# Patient Record
Sex: Female | Born: 1985 | Race: White | Hispanic: No | Marital: Married | Smoking: Former smoker
Health system: Southern US, Community
[De-identification: ages and names within clinical notes are randomized; demographics above are authoritative.]

## PROBLEM LIST (undated history)

## (undated) ENCOUNTER — Inpatient Hospital Stay (HOSPITAL_COMMUNITY): Payer: Self-pay

## (undated) DIAGNOSIS — K219 Gastro-esophageal reflux disease without esophagitis: Secondary | ICD-10-CM

## (undated) DIAGNOSIS — Z8619 Personal history of other infectious and parasitic diseases: Secondary | ICD-10-CM

## (undated) DIAGNOSIS — M5430 Sciatica, unspecified side: Secondary | ICD-10-CM

## (undated) DIAGNOSIS — L680 Hirsutism: Secondary | ICD-10-CM

## (undated) DIAGNOSIS — R12 Heartburn: Secondary | ICD-10-CM

## (undated) DIAGNOSIS — E282 Polycystic ovarian syndrome: Secondary | ICD-10-CM

## (undated) DIAGNOSIS — I079 Rheumatic tricuspid valve disease, unspecified: Secondary | ICD-10-CM

## (undated) DIAGNOSIS — G43909 Migraine, unspecified, not intractable, without status migrainosus: Secondary | ICD-10-CM

## (undated) DIAGNOSIS — I4510 Unspecified right bundle-branch block: Secondary | ICD-10-CM

## (undated) DIAGNOSIS — L821 Other seborrheic keratosis: Secondary | ICD-10-CM

## (undated) DIAGNOSIS — R87629 Unspecified abnormal cytological findings in specimens from vagina: Secondary | ICD-10-CM

## (undated) DIAGNOSIS — R202 Paresthesia of skin: Secondary | ICD-10-CM

## (undated) DIAGNOSIS — O26899 Other specified pregnancy related conditions, unspecified trimester: Secondary | ICD-10-CM

## (undated) DIAGNOSIS — IMO0001 Reserved for inherently not codable concepts without codable children: Secondary | ICD-10-CM

## (undated) DIAGNOSIS — K589 Irritable bowel syndrome without diarrhea: Secondary | ICD-10-CM

## (undated) HISTORY — DX: Hirsutism: L68.0

## (undated) HISTORY — DX: Migraine, unspecified, not intractable, without status migrainosus: G43.909

## (undated) HISTORY — DX: Other seborrheic keratosis: L82.1

## (undated) HISTORY — DX: Polycystic ovarian syndrome: E28.2

## (undated) HISTORY — DX: Unspecified right bundle-branch block: I45.10

## (undated) HISTORY — DX: Rheumatic tricuspid valve disease, unspecified: I07.9

## (undated) HISTORY — DX: Irritable bowel syndrome, unspecified: K58.9

## (undated) HISTORY — PX: WISDOM TOOTH EXTRACTION: SHX21

## (undated) HISTORY — DX: Gastro-esophageal reflux disease without esophagitis: K21.9

## (undated) HISTORY — PX: COLONOSCOPY: SHX174

## (undated) HISTORY — DX: Personal history of other infectious and parasitic diseases: Z86.19

## (undated) HISTORY — DX: Sciatica, unspecified side: M54.30

---

## 2011-04-27 LAB — ABO/RH

## 2011-04-27 LAB — HIV ANTIBODY (ROUTINE TESTING W REFLEX): HIV: NONREACTIVE

## 2011-05-15 LAB — GC/CHLAMYDIA PROBE AMP, GENITAL: Gonorrhea: NEGATIVE

## 2011-08-02 ENCOUNTER — Inpatient Hospital Stay (HOSPITAL_COMMUNITY): Payer: Managed Care, Other (non HMO)

## 2011-08-02 ENCOUNTER — Encounter (HOSPITAL_COMMUNITY): Payer: Self-pay | Admitting: *Deleted

## 2011-08-02 ENCOUNTER — Inpatient Hospital Stay (HOSPITAL_COMMUNITY)
Admission: AD | Admit: 2011-08-02 | Discharge: 2011-08-02 | Disposition: A | Discharge status: Home / Self Care | Payer: Managed Care, Other (non HMO) | Source: Ambulatory Visit | Attending: Obstetrics and Gynecology | Admitting: Obstetrics and Gynecology

## 2011-08-02 DIAGNOSIS — O99891 Other specified diseases and conditions complicating pregnancy: Secondary | ICD-10-CM | POA: Insufficient documentation

## 2011-08-02 NOTE — ED Provider Notes (Signed)
History  Christine Castro 25 y.o. G1P0 23 wks   RP:  Fluid gush vaginally x 2-3 episodes over night after IC.  HPI:  2-3 episodes of fluid gushes vaginally after being sexually active.  No vaginal bleeding.  No UCs, no abdopelvic pain.  FMs pos.       Chief Complaint  Patient presents with  . Rupture of Membranes    OB History    Grav Para Term Preterm Abortions TAB SAB Ect Mult Living   1               No past medical history on file.  Past Surgical History  Procedure Date  . No past surgeries     No family history on file.  History  Substance Use Topics  . Smoking status: Never Smoker   . Smokeless tobacco: Not on file  . Alcohol Use: No    Allergies:  Allergies  Allergen Reactions  . Aspirin Swelling    face  . Darvocet (Propoxyphene N-Acetaminophen) Rash  . Gloves Rash    NITRO GLOVES    Prescriptions prior to admission  Medication Sig Dispense Refill  . butalbital-acetaminophen-caffeine (FIORICET) 50-325-40 MG per tablet Take 1 tablet by mouth 2 (two) times daily as needed. For migraine       . Calcium Carbonate Antacid (TUMS PO) Take 1 tablet by mouth daily.        . Prenat-FeFum-FePo-FA-Omega 3 (CONCEPT DHA) 53.5-38-1 MG CAPS Take 1 tablet by mouth daily.          Physical Exam   Blood pressure 144/83, pulse 87, temperature 98.6 F (37 C), resp. rate 18.  Speculum:  Whitish secretions wnl, no AF visible.  Had IC with ejaculation <12 hrs ago.  Cervix closed and long.  [place] AFI >18 cm.  Cervix >3 cm, closed. FHR present   ED Course  23 wks with no evidence of PPROM per exam and [place].  No sign of PTL.  Recommendations discussed.  Reassured. F/U Dr Cherly Hensen as scheduled.  Genia Del MD

## 2011-08-02 NOTE — Progress Notes (Signed)
Pt reports she awakened between 0000-0100 and had a big gush of clear fluid, has happened x 2 since then . Some cramping and lower back pain. . Denies problems with pregnancy

## 2011-08-02 NOTE — Progress Notes (Signed)
[place],NP called to do medical screening on pt because physician will not be in for 2.5 hours.

## 2011-11-07 LAB — STREP B DNA PROBE: GBS: NEGATIVE

## 2011-11-22 ENCOUNTER — Encounter (HOSPITAL_COMMUNITY): Payer: Self-pay | Admitting: *Deleted

## 2011-11-22 ENCOUNTER — Other Ambulatory Visit: Payer: Self-pay | Admitting: Obstetrics and Gynecology

## 2011-11-22 ENCOUNTER — Telehealth (HOSPITAL_COMMUNITY): Payer: Self-pay | Admitting: *Deleted

## 2011-11-22 NOTE — Telephone Encounter (Signed)
Preadmission screen  

## 2011-11-30 ENCOUNTER — Inpatient Hospital Stay (HOSPITAL_COMMUNITY)
Admission: RE | Admit: 2011-11-30 | Discharge: 2011-12-04 | DRG: 765 | Disposition: A | Discharge status: Home / Self Care | Payer: Managed Care, Other (non HMO) | Source: Ambulatory Visit | Attending: Obstetrics and Gynecology | Admitting: Obstetrics and Gynecology

## 2011-11-30 ENCOUNTER — Encounter (HOSPITAL_COMMUNITY): Payer: Self-pay

## 2011-11-30 DIAGNOSIS — O9902 Anemia complicating childbirth: Secondary | ICD-10-CM | POA: Diagnosis present

## 2011-11-30 DIAGNOSIS — D509 Iron deficiency anemia, unspecified: Secondary | ICD-10-CM | POA: Diagnosis present

## 2011-11-30 DIAGNOSIS — O3660X0 Maternal care for excessive fetal growth, unspecified trimester, not applicable or unspecified: Secondary | ICD-10-CM | POA: Diagnosis present

## 2011-11-30 DIAGNOSIS — O139 Gestational [pregnancy-induced] hypertension without significant proteinuria, unspecified trimester: Secondary | ICD-10-CM | POA: Diagnosis present

## 2011-11-30 DIAGNOSIS — IMO0001 Reserved for inherently not codable concepts without codable children: Secondary | ICD-10-CM | POA: Diagnosis present

## 2011-11-30 DIAGNOSIS — O480 Post-term pregnancy: Principal | ICD-10-CM | POA: Diagnosis present

## 2011-11-30 HISTORY — DX: Reserved for inherently not codable concepts without codable children: IMO0001

## 2011-11-30 LAB — COMPREHENSIVE METABOLIC PANEL
Albumin: 2.6 g/dL — ABNORMAL LOW (ref 3.5–5.2)
BUN: 9 mg/dL (ref 6–23)
Calcium: 9.2 mg/dL (ref 8.4–10.5)
Creatinine, Ser: 0.59 mg/dL (ref 0.50–1.10)
GFR calc Af Amer: >90 mL/min (ref >90)
Glucose, Bld: 81 mg/dL (ref 70–99)
Total Protein: 5.8 g/dL — ABNORMAL LOW (ref 6.0–8.3)

## 2011-11-30 LAB — CBC
MCH: 24.9 pg — ABNORMAL LOW (ref 26.0–34.0)
MCHC: 31.8 g/dL (ref 30.0–36.0)
MCV: 78.2 fL (ref 78.0–100.0)
Platelets: 210 10*3/uL (ref 150–400)
RBC: 4.18 MIL/uL (ref 3.87–5.11)
RDW: 15.2 % (ref 11.5–15.5)

## 2011-11-30 LAB — URIC ACID: Uric Acid, Serum: 4.9 mg/dL (ref 2.4–7.0)

## 2011-11-30 MED ORDER — OXYTOCIN BOLUS FROM INFUSION
500.0000 mL | Freq: Once | INTRAVENOUS | Status: DC
  Filled 2011-11-30: qty 500

## 2011-11-30 MED ORDER — LIDOCAINE HCL (PF) 1 % IJ SOLN: 30.0000 mL | INTRAMUSCULAR | Status: DC | PRN

## 2011-11-30 MED ORDER — OXYTOCIN 10 UNIT/ML IJ SOLN: 10.0000 [IU] | Freq: Once | INTRAMUSCULAR | Status: DC

## 2011-11-30 MED ORDER — TERBUTALINE SULFATE 1 MG/ML IJ SOLN: 0.2500 mg | Freq: Once | INTRAMUSCULAR | Status: AC | PRN

## 2011-11-30 MED ORDER — MISOPROSTOL 25 MCG QUARTER TABLET
25.0000 ug | ORAL_TABLET | ORAL | Status: DC | PRN
  Administered 2011-11-30 – 2011-12-01 (×3): 25 ug via VAGINAL
  Filled 2011-11-30 (×3): qty 0.25

## 2011-11-30 MED ORDER — ZOLPIDEM TARTRATE 10 MG PO TABS
10.0000 mg | ORAL_TABLET | Freq: Every evening | ORAL | Status: DC | PRN
  Administered 2011-11-30: 10 mg via ORAL
  Filled 2011-11-30: qty 1

## 2011-11-30 MED ORDER — ACETAMINOPHEN 325 MG PO TABS: 650.0000 mg | ORAL_TABLET | ORAL | Status: DC | PRN

## 2011-11-30 MED ORDER — ONDANSETRON HCL 4 MG/2ML IJ SOLN: 4.0000 mg | Freq: Four times a day (QID) | INTRAMUSCULAR | Status: DC | PRN

## 2011-11-30 MED ORDER — CITRIC ACID-SODIUM CITRATE 334-500 MG/5ML PO SOLN
30.0000 mL | ORAL | Status: DC | PRN
  Administered 2011-11-30 – 2011-12-01 (×4): 30 mL via ORAL
  Filled 2011-11-30 (×4): qty 15

## 2011-11-30 MED ORDER — LACTATED RINGERS IV SOLN
INTRAVENOUS | Status: DC
  Administered 2011-11-30 – 2011-12-01 (×4): via INTRAVENOUS

## 2011-11-30 MED ORDER — OXYTOCIN 20 UNITS IN LACTATED RINGERS INFUSION - SIMPLE: 125.0000 mL/h | Freq: Once | INTRAVENOUS | Status: DC

## 2011-11-30 MED ORDER — LACTATED RINGERS IV SOLN
500.0000 mL | INTRAVENOUS | Status: DC | PRN
  Administered 2011-12-01: 1000 mL via INTRAVENOUS

## 2011-11-30 NOTE — H&P (Signed)
Christine Castro is a 26 y.o. female presenting for induction 2nd to postdates and mild PIH History OB History    Grav Para Term Preterm Abortions TAB SAB Ect Mult Living   1 0 0 0 0 0 0 0 0 0      Past Medical History  Diagnosis Date  . Sciatica   . GERD (gastroesophageal reflux disease)   . Polycystic ovaries   . Hirsutism   . Abnormal Pap smear   . Other seborrheic keratosis   . Migraine, unspecified, without mention of intractable migraine without mention of status migrainosus   . History of chicken pox   . Diseases of tricuspid valve   . IBS (irritable bowel syndrome)   . Right bundle branch block     incomplete, assumed congenital,    Past Surgical History  Procedure Date  . No past surgeries   . Wisdom tooth extraction   . Colonoscopy    Family History: family history includes COPD in her paternal grandmother; Cancer in her maternal grandfather; Crohn's disease in her mother; Depression in her maternal grandmother and mother; Diverticulitis in her maternal grandmother; Eczema in her mother; Heart disease in her paternal grandmother; Hypertension in her maternal grandfather, maternal grandmother, and paternal grandmother; Migraines in her mother; Muscular dystrophy in her cousin; Peripheral vascular disease in her maternal grandmother, mother, and paternal grandmother; Rheum arthritis in her father; and Stroke in her maternal grandfather and paternal grandmother. Social History:  reports that she has never smoked. She has never used smokeless tobacco. She reports that she does not drink alcohol or use illicit drugs.  ROS neg  Dilation: 1 Effacement (%): Thick Station: -3 Exam by:: L.Mears,Rn Blood pressure 130/78, pulse 89, temperature 98.2 F (36.8 C), temperature source Oral, height 5\' 4"  (1.626 m), weight 190 lb (86.183 kg). Maternal Exam:  Abdomen: Patient reports no abdominal tenderness. Estimated fetal weight is 9lb.   Fetal presentation: vertex     Physical Exam    Constitutional: She is oriented to person, place, and time. She appears well-developed and well-nourished.  Neck: Neck supple.  Cardiovascular: Regular rhythm.   Respiratory: Breath sounds normal.  GI: Soft.  Musculoskeletal: She exhibits edema.  Neurological: She is alert and oriented to person, place, and time.  Skin: Skin is warm and dry.    Prenatal labs: ABO, Rh: O/Positive/-- (07/26 0000) Antibody: Negative (07/26 0000) Rubella: Immune (07/26 0000) RPR: Nonreactive (07/26 0000)  HBsAg: Negative (07/26 0000)  HIV: Non-reactive (07/26 0000)  GBS: Negative (02/05 0000)   Assessment/Plan:Postdate PIH P) admit cytotec PIH labs analgesic prn, low dose pitocin in am   Huda Petrey A 11/30/2011, 10:44 PM

## 2011-12-01 ENCOUNTER — Encounter (HOSPITAL_COMMUNITY): Payer: Self-pay

## 2011-12-01 ENCOUNTER — Encounter (HOSPITAL_COMMUNITY): Payer: Self-pay | Admitting: Anesthesiology

## 2011-12-01 ENCOUNTER — Encounter (HOSPITAL_COMMUNITY)
Admission: RE | Disposition: A | Discharge status: Home / Self Care | Payer: Self-pay | Source: Ambulatory Visit | Attending: Obstetrics and Gynecology

## 2011-12-01 ENCOUNTER — Inpatient Hospital Stay (HOSPITAL_COMMUNITY): Payer: Managed Care, Other (non HMO) | Admitting: Anesthesiology

## 2011-12-01 LAB — RPR: RPR Ser Ql: NONREACTIVE

## 2011-12-01 LAB — COMPREHENSIVE METABOLIC PANEL
Albumin: 2.3 g/dL — ABNORMAL LOW (ref 3.5–5.2)
BUN: 6 mg/dL (ref 6–23)
Calcium: 8.9 mg/dL (ref 8.4–10.5)
Chloride: 100 mEq/L (ref 96–112)
Creatinine, Ser: 0.65 mg/dL (ref 0.50–1.10)
Total Bilirubin: 0.4 mg/dL (ref 0.3–1.2)
Total Protein: 5.2 g/dL — ABNORMAL LOW (ref 6.0–8.3)

## 2011-12-01 LAB — CBC
HCT: 31.4 % — ABNORMAL LOW (ref 36.0–46.0)
MCH: 24.5 pg — ABNORMAL LOW (ref 26.0–34.0)
MCHC: 31.2 g/dL (ref 30.0–36.0)
MCV: 78.5 fL (ref 78.0–100.0)
RDW: 15.2 % (ref 11.5–15.5)

## 2011-12-01 LAB — URIC ACID: Uric Acid, Serum: 5 mg/dL (ref 2.4–7.0)

## 2011-12-01 SURGERY — Surgical Case
Anesthesia: Regional

## 2011-12-01 MED ORDER — OXYTOCIN 10 UNIT/ML IJ SOLN
INTRAMUSCULAR | Status: AC
  Filled 2011-12-01: qty 4

## 2011-12-01 MED ORDER — ONDANSETRON HCL 4 MG/2ML IJ SOLN
INTRAMUSCULAR | Status: DC | PRN
  Administered 2011-12-01: 4 mg via INTRAVENOUS

## 2011-12-01 MED ORDER — LACTATED RINGERS IV SOLN: 500.0000 mL | Freq: Once | INTRAVENOUS | Status: DC

## 2011-12-01 MED ORDER — EPHEDRINE 5 MG/ML INJ: 10.0000 mg | INTRAVENOUS | Status: DC | PRN

## 2011-12-01 MED ORDER — OXYTOCIN 20 UNITS IN LACTATED RINGERS INFUSION - SIMPLE
1.0000 m[IU]/min | INTRAVENOUS | Status: DC
  Administered 2011-12-01: 2 m[IU]/min via INTRAVENOUS
  Filled 2011-12-01: qty 1000

## 2011-12-01 MED ORDER — CEFAZOLIN SODIUM 1-5 GM-% IV SOLN
INTRAVENOUS | Status: AC
  Filled 2011-12-01: qty 100

## 2011-12-01 MED ORDER — BUPIVACAINE HCL (PF) 0.25 % IJ SOLN
INTRAMUSCULAR | Status: AC
  Filled 2011-12-01: qty 30

## 2011-12-01 MED ORDER — BUPIVACAINE HCL (PF) 0.25 % IJ SOLN
INTRAMUSCULAR | Status: DC | PRN
  Administered 2011-12-01: 5 mL via EPIDURAL

## 2011-12-01 MED ORDER — HYDROMORPHONE HCL PF 1 MG/ML IJ SOLN
INTRAMUSCULAR | Status: DC | PRN
  Administered 2011-12-01: 1 mg via INTRAVENOUS

## 2011-12-01 MED ORDER — SODIUM BICARBONATE 8.4 % IV SOLN
INTRAVENOUS | Status: DC | PRN
  Administered 2011-12-01: 4 mL via EPIDURAL

## 2011-12-01 MED ORDER — MEPERIDINE HCL 25 MG/ML IJ SOLN
INTRAMUSCULAR | Status: DC | PRN
  Administered 2011-12-01: 25 mg via INTRAVENOUS

## 2011-12-01 MED ORDER — HYDROMORPHONE HCL PF 1 MG/ML IJ SOLN
INTRAMUSCULAR | Status: AC
  Filled 2011-12-01: qty 1

## 2011-12-01 MED ORDER — MORPHINE SULFATE 0.5 MG/ML IJ SOLN
INTRAMUSCULAR | Status: AC
  Filled 2011-12-01: qty 10

## 2011-12-01 MED ORDER — SODIUM BICARBONATE 8.4 % IV SOLN
INTRAVENOUS | Status: AC
  Filled 2011-12-01: qty 50

## 2011-12-01 MED ORDER — PHENYLEPHRINE HCL 10 MG/ML IJ SOLN
INTRAMUSCULAR | Status: DC | PRN
  Administered 2011-12-01: 80 ug via INTRAVENOUS

## 2011-12-01 MED ORDER — SCOPOLAMINE 1 MG/3DAYS TD PT72
1.0000 | MEDICATED_PATCH | Freq: Once | TRANSDERMAL | Status: DC
  Administered 2011-12-01: 1.5 mg via TRANSDERMAL

## 2011-12-01 MED ORDER — ONDANSETRON HCL 4 MG/2ML IJ SOLN
INTRAMUSCULAR | Status: AC
  Filled 2011-12-01: qty 2

## 2011-12-01 MED ORDER — LIDOCAINE-EPINEPHRINE (PF) 2 %-1:200000 IJ SOLN
INTRAMUSCULAR | Status: AC
  Filled 2011-12-01: qty 20

## 2011-12-01 MED ORDER — CHLOROPROCAINE HCL 3 % IJ SOLN
INTRAMUSCULAR | Status: AC
  Filled 2011-12-01: qty 20

## 2011-12-01 MED ORDER — PHENYLEPHRINE 40 MCG/ML (10ML) SYRINGE FOR IV PUSH (FOR BLOOD PRESSURE SUPPORT): 80.0000 ug | PREFILLED_SYRINGE | INTRAVENOUS | Status: DC | PRN

## 2011-12-01 MED ORDER — CEFAZOLIN SODIUM 1-5 GM-% IV SOLN
INTRAVENOUS | Status: DC | PRN
  Administered 2011-12-01: 2 g via INTRAVENOUS

## 2011-12-01 MED ORDER — MEPERIDINE HCL 25 MG/ML IJ SOLN
INTRAMUSCULAR | Status: AC
  Filled 2011-12-01: qty 1

## 2011-12-01 MED ORDER — PHENYLEPHRINE 40 MCG/ML (10ML) SYRINGE FOR IV PUSH (FOR BLOOD PRESSURE SUPPORT)
80.0000 ug | PREFILLED_SYRINGE | INTRAVENOUS | Status: DC | PRN
  Filled 2011-12-01: qty 5

## 2011-12-01 MED ORDER — FENTANYL CITRATE 0.05 MG/ML IJ SOLN: 25.0000 ug | INTRAMUSCULAR | Status: DC | PRN

## 2011-12-01 MED ORDER — SCOPOLAMINE 1 MG/3DAYS TD PT72
MEDICATED_PATCH | TRANSDERMAL | Status: AC
  Filled 2011-12-01: qty 1

## 2011-12-01 MED ORDER — OXYTOCIN 10 UNIT/ML IJ SOLN
INTRAMUSCULAR | Status: DC | PRN
  Administered 2011-12-01: 40 [IU]
  Administered 2011-12-01: 20 [IU]

## 2011-12-01 MED ORDER — EPHEDRINE 5 MG/ML INJ
10.0000 mg | INTRAVENOUS | Status: DC | PRN
  Filled 2011-12-01: qty 4

## 2011-12-01 MED ORDER — LABETALOL HCL 100 MG PO TABS
100.0000 mg | ORAL_TABLET | Freq: Two times a day (BID) | ORAL | Status: DC
  Filled 2011-12-01 (×5): qty 1

## 2011-12-01 MED ORDER — BUPIVACAINE HCL (PF) 0.25 % IJ SOLN
INTRAMUSCULAR | Status: DC | PRN
  Administered 2011-12-01: 6 mL

## 2011-12-01 MED ORDER — LACTATED RINGERS IV SOLN
INTRAVENOUS | Status: DC | PRN
  Administered 2011-12-01 (×3): via INTRAVENOUS

## 2011-12-01 MED ORDER — PHENYLEPHRINE 40 MCG/ML (10ML) SYRINGE FOR IV PUSH (FOR BLOOD PRESSURE SUPPORT)
PREFILLED_SYRINGE | INTRAVENOUS | Status: AC
  Filled 2011-12-01: qty 5

## 2011-12-01 MED ORDER — DIPHENHYDRAMINE HCL 50 MG/ML IJ SOLN: 12.5000 mg | INTRAMUSCULAR | Status: DC | PRN

## 2011-12-01 MED ORDER — MORPHINE SULFATE (PF) 0.5 MG/ML IJ SOLN
INTRAMUSCULAR | Status: DC | PRN
  Administered 2011-12-01: 4 mg via EPIDURAL
  Administered 2011-12-01: 1 mg via INTRAVENOUS

## 2011-12-01 MED ORDER — FENTANYL 2.5 MCG/ML BUPIVACAINE 1/10 % EPIDURAL INFUSION (WH - ANES)
14.0000 mL/h | INTRAMUSCULAR | Status: DC
Start: 2011-12-01 — End: 2011-12-01
  Administered 2011-12-01 (×4): 14 mL/h via EPIDURAL
  Filled 2011-12-01 (×4): qty 60

## 2011-12-01 MED ORDER — OXYTOCIN 10 UNIT/ML IJ SOLN
INTRAMUSCULAR | Status: AC
  Filled 2011-12-01: qty 2

## 2011-12-01 MED ORDER — FENTANYL 2.5 MCG/ML BUPIVACAINE 1/10 % EPIDURAL INFUSION (WH - ANES)
INTRAMUSCULAR | Status: DC | PRN
  Administered 2011-12-01: 14 mL/h via EPIDURAL

## 2011-12-01 MED ORDER — TERBUTALINE SULFATE 1 MG/ML IJ SOLN: 0.2500 mg | Freq: Once | INTRAMUSCULAR | Status: DC | PRN

## 2011-12-01 MED ORDER — MEPERIDINE HCL 25 MG/ML IJ SOLN: 6.2500 mg | INTRAMUSCULAR | Status: DC | PRN

## 2011-12-01 SURGICAL SUPPLY — 41 items
BENZOIN TINCTURE PRP APPL 2/3 (GAUZE/BANDAGES/DRESSINGS) IMPLANT
CHLORAPREP W/TINT 26ML (MISCELLANEOUS) ×2 IMPLANT
CLOTH BEACON ORANGE TIMEOUT ST (SAFETY) ×2 IMPLANT
CONTAINER PREFILL 10% NBF 15ML (MISCELLANEOUS) IMPLANT
DRESSING TELFA 8X3 (GAUZE/BANDAGES/DRESSINGS) ×2 IMPLANT
DRSG COVADERM 4X8 (GAUZE/BANDAGES/DRESSINGS) ×2 IMPLANT
ELECT REM PT RETURN 9FT ADLT (ELECTROSURGICAL) ×2
ELECTRODE REM PT RTRN 9FT ADLT (ELECTROSURGICAL) ×1 IMPLANT
EXTRACTOR VACUUM KIWI (MISCELLANEOUS) IMPLANT
EXTRACTOR VACUUM M CUP 4 TUBE (SUCTIONS) IMPLANT
GAUZE SPONGE 4X4 12PLY STRL LF (GAUZE/BANDAGES/DRESSINGS) ×4 IMPLANT
GLOVE BIO SURGEON STRL SZ 6.5 (GLOVE) ×2 IMPLANT
GLOVE BIOGEL PI IND STRL 7.0 (GLOVE) ×2 IMPLANT
GLOVE BIOGEL PI INDICATOR 7.0 (GLOVE) ×2
GLOVE SURG SS PI 7.5 STRL IVOR (GLOVE) ×2 IMPLANT
GOWN PREVENTION PLUS LG XLONG (DISPOSABLE) ×6 IMPLANT
KIT ABG SYR 3ML LUER SLIP (SYRINGE) IMPLANT
NEEDLE HYPO 25X1 1.5 SAFETY (NEEDLE) ×2 IMPLANT
NEEDLE HYPO 25X5/8 SAFETYGLIDE (NEEDLE) IMPLANT
NS IRRIG 1000ML POUR BTL (IV SOLUTION) ×2 IMPLANT
PACK C SECTION WH (CUSTOM PROCEDURE TRAY) ×2 IMPLANT
PAD ABD 7.5X8 STRL (GAUZE/BANDAGES/DRESSINGS) ×2 IMPLANT
RTRCTR C-SECT PINK 25CM LRG (MISCELLANEOUS) IMPLANT
SLEEVE SCD COMPRESS KNEE MED (MISCELLANEOUS) IMPLANT
STAPLER VISISTAT 35W (STAPLE) IMPLANT
STRIP CLOSURE SKIN 1/2X4 (GAUZE/BANDAGES/DRESSINGS) IMPLANT
SUT CHROMIC GUT AB #0 18 (SUTURE) IMPLANT
SUT MNCRL 0 VIOLET CTX 36 (SUTURE) ×3 IMPLANT
SUT MON AB 4-0 PS1 27 (SUTURE) IMPLANT
SUT MONOCRYL 0 CTX 36 (SUTURE) ×3
SUT PLAIN 2 0 (SUTURE)
SUT PLAIN ABS 2-0 CT1 27XMFL (SUTURE) IMPLANT
SUT VIC AB 0 CT1 27 (SUTURE) ×2
SUT VIC AB 0 CT1 27XBRD ANBCTR (SUTURE) ×2 IMPLANT
SUT VIC AB 2-0 CT1 27 (SUTURE) ×1
SUT VIC AB 2-0 CT1 TAPERPNT 27 (SUTURE) ×1 IMPLANT
SUT VICRYL 0 TIES 12 18 (SUTURE) IMPLANT
SYR CONTROL 10ML LL (SYRINGE) ×2 IMPLANT
TOWEL OR 17X24 6PK STRL BLUE (TOWEL DISPOSABLE) ×4 IMPLANT
TRAY FOLEY CATH 14FR (SET/KITS/TRAYS/PACK) IMPLANT
WATER STERILE IRR 1000ML POUR (IV SOLUTION) ×2 IMPLANT

## 2011-12-01 NOTE — Progress Notes (Signed)
Christine Castro is a 26 y.o. G1P0000 at [redacted]w[redacted]d by LMP admitted for induction of labor due to Post dates. Due date 11/29/2011. (+) PIH  Subjective: No chief complaint on file.   Objective: BP 141/98  Pulse 93  Temp(Src) 99 F (37.2 C) (Oral)  Resp 18  Ht 5\' 4"  (1.626 m)  Wt 190 lb (86.183 kg)  BMI 32.61 kg/m2  SpO2 97%      FHT:  FHR: 140 bpm, variability: moderate,  accelerations:  Present,  decelerations:  Absent UC:   regular, every 1-3 minutes SVE:  6cm/90/-1  Labs: Lab Results  Component Value Date   WBC 13.9* 12/01/2011   HGB 9.8* 12/01/2011   HCT 31.4* 12/01/2011   MCV 78.5 12/01/2011   PLT 177 12/01/2011    Assessment / Plan: Arrest in active phase of labor  P) recommend Primary C/S. Risk of surgery reviewed including but not limited to infection, bleeding, injury to bladder, bowels, ureters, poss need for C/S in  The future. All ? Answered  Anticipated MOD:  Primary C/S  Kobie Matkins A 12/01/2011, 8:40 PM

## 2011-12-01 NOTE — Anesthesia Preprocedure Evaluation (Signed)
Anesthesia Evaluation  Patient identified by MRN, date of birth, ID band Patient awake    Reviewed: Allergy & Precautions, H&P , Patient's Chart, lab work & pertinent test results  Airway Mallampati: II TM Distance: >3 FB Neck ROM: full    Dental  (+) Teeth Intact   Pulmonary  clear to auscultation        Cardiovascular regular Normal    Neuro/Psych    GI/Hepatic GERD-  Medicated,  Endo/Other    Renal/GU      Musculoskeletal   Abdominal   Peds  Hematology   Anesthesia Other Findings       Reproductive/Obstetrics (+) Pregnancy                           Anesthesia Physical Anesthesia Plan  ASA: II  Anesthesia Plan: Epidural   Post-op Pain Management:    Induction:   Airway Management Planned:   Additional Equipment:   Intra-op Plan:   Post-operative Plan:   Informed Consent: I have reviewed the patients History and Physical, chart, labs and discussed the procedure including the risks, benefits and alternatives for the proposed anesthesia with the patient or authorized representative who has indicated his/her understanding and acceptance.   Dental Advisory Given  Plan Discussed with:   Anesthesia Plan Comments: (Labs checked- platelets confirmed with RN in room. Fetal heart tracing, per RN, reported to be stable enough for sitting procedure. Discussed epidural, and patient consents to the procedure:  included risk of possible headache,backache, failed block, allergic reaction, and nerve injury. This patient was asked if she had any questions or concerns before the procedure started. )        Anesthesia Quick Evaluation

## 2011-12-01 NOTE — Progress Notes (Signed)
S: c/o pain on right side. (+) epidural  O> BP 138/88. Pitocin 2 miu VE deferred 2nd to dysfunctional pattern Tracing: baseline 140 ctx q 1-3 mins  A/P: Postdate PIH P) cont w/ pitocin until reg pattern

## 2011-12-01 NOTE — Anesthesia Postprocedure Evaluation (Signed)
Anesthesia Post Note  Patient: Christine Castro  Procedure(s) Performed: Procedure(s) (LRB): CESAREAN SECTION (N/A)  Anesthesia type: Epidural  Patient location: PACU  Post pain: Pain level controlled  Post assessment: Post-op Vital signs reviewed  Last Vitals:  Filed Vitals:   12/01/11 2230  BP: 126/95  Pulse: 102  Temp: 36.8 C  Resp: 16    Post vital signs: stable  Level of consciousness: awake  Complications: No apparent anesthesia complications

## 2011-12-01 NOTE — Progress Notes (Signed)
S: epidural. S/P cytotec x 3 (+) ctx  O: VE 4/60/-3/-2 applied. AROM copious clear fluid IUPC placed. Tracing baseline 130 reacitve  Ctx q 1-2 mins( external)  BP 129/96  IMP: postdates PIH P) assess ctx. Pitocin prn. Exaggerated left sims

## 2011-12-01 NOTE — Anesthesia Procedure Notes (Signed)

## 2011-12-01 NOTE — Preoperative (Signed)
Beta Blockers   Reason not to administer Beta Blockers:Not Applicable 

## 2011-12-01 NOTE — Progress Notes (Signed)
S: comfortable. Per pt, feels some changes vaginally  O: Pitocin 10 MIU Tracing reassuring baseline 140 Ctx q 1-2 1/2 mins VE 6/90/-2/-1  ~200 MV units  IMP: Arrest of dilation Postdates P) disc primary C/S vs waiting for more time. Pt opts for the latter. Wants to wait a couple more hours. Given tracing reassuring. Ok to wait. Recheck 2 hrs

## 2011-12-01 NOTE — Transfer of Care (Signed)
Immediate Anesthesia Transfer of Care Note  Patient: Christine Castro  Procedure(s) Performed: Procedure(s) (LRB): CESAREAN SECTION (N/A)  Patient Location: PACU  Anesthesia Type: Epidural  Level of Consciousness: awake, alert  and patient cooperative  Airway & Oxygen Therapy: Patient Spontanous Breathing  Post-op Assessment: Report given to PACU RN  Post vital signs: Reviewed  Complications: No apparent anesthesia complications

## 2011-12-01 NOTE — Brief Op Note (Signed)
11/30/2011 - 12/01/2011  9:45 PM  PATIENT:  Christian Hauge  26 y.o. female  PRE-OPERATIVE DIAGNOSIS:  Arrest of dilation, PIH,  postdates  POST-OPERATIVE DIAGNOSIS:  Arrest of dilation, postdates, PIH, fetal macrosomia, ROP presentation  PROCEDURE:  Procedure(s) (LRB):PRIMARY LOW TRANSVERSE CESAREAN SECTION (N/A)  SURGEON:  Surgeon(s) and Role:    * Marcine Gadway Cathie Beams, MD - Primary  PHYSICIAN ASSISTANT:   ASSISTANTS: none   ANESTHESIA:   epidural FINDINGS: LIVE FEMALE ROP, CAN x1, NL TUBES AND OVARIES, 9LB 5 OZ EBL:  Total I/O In: 1400 [I.V.:1400] Out: 950 [Urine:250; Blood:700]  BLOOD ADMINISTERED:none  DRAINS: none   LOCAL MEDICATIONS USED:  MARCAINE     SPECIMEN:  Source of Specimen:  placenta  DISPOSITION OF SPECIMEN:  N/A  COUNTS:  YES  TOURNIQUET:  * No tourniquets in log *  DICTATION: .Other Dictation: Dictation Number   PLAN OF CARE: Admit to inpatient   PATIENT DISPOSITION:  PACU - hemodynamically stable.   Delay start of Pharmacological VTE agent (>24hrs) due to surgical blood loss or risk of bleeding: no

## 2011-12-02 ENCOUNTER — Encounter (HOSPITAL_COMMUNITY): Payer: Self-pay

## 2011-12-02 DIAGNOSIS — IMO0001 Reserved for inherently not codable concepts without codable children: Secondary | ICD-10-CM

## 2011-12-02 HISTORY — DX: Reserved for inherently not codable concepts without codable children: IMO0001

## 2011-12-02 LAB — CBC
Hemoglobin: 8.5 g/dL — ABNORMAL LOW (ref 12.0–15.0)
MCV: 78.9 fL (ref 78.0–100.0)
Platelets: 190 10*3/uL (ref 150–400)
RBC: 3.41 MIL/uL — ABNORMAL LOW (ref 3.87–5.11)
WBC: 17.7 10*3/uL — ABNORMAL HIGH (ref 4.0–10.5)

## 2011-12-02 MED ORDER — NALOXONE HCL 0.4 MG/ML IJ SOLN: 0.4000 mg | INTRAMUSCULAR | Status: DC | PRN

## 2011-12-02 MED ORDER — NALBUPHINE HCL 10 MG/ML IJ SOLN
5.0000 mg | INTRAMUSCULAR | Status: DC | PRN
  Administered 2011-12-02 (×2): 5 mg via INTRAVENOUS
  Administered 2011-12-02: 10 mg via INTRAVENOUS
  Filled 2011-12-02 (×3): qty 1

## 2011-12-02 MED ORDER — ONDANSETRON HCL 4 MG PO TABS: 4.0000 mg | ORAL_TABLET | ORAL | Status: DC | PRN

## 2011-12-02 MED ORDER — CEFAZOLIN SODIUM 1-5 GM-% IV SOLN
1.0000 g | Freq: Three times a day (TID) | INTRAVENOUS | Status: AC
  Administered 2011-12-02 (×2): 1 g via INTRAVENOUS
  Filled 2011-12-02 (×2): qty 50

## 2011-12-02 MED ORDER — SIMETHICONE 80 MG PO CHEW
80.0000 mg | CHEWABLE_TABLET | ORAL | Status: DC | PRN
  Administered 2011-12-02: 80 mg via ORAL

## 2011-12-02 MED ORDER — ONDANSETRON HCL 4 MG/2ML IJ SOLN: 4.0000 mg | Freq: Three times a day (TID) | INTRAMUSCULAR | Status: DC | PRN

## 2011-12-02 MED ORDER — SODIUM CHLORIDE 0.9 % IJ SOLN: 3.0000 mL | INTRAMUSCULAR | Status: DC | PRN

## 2011-12-02 MED ORDER — DIPHENHYDRAMINE HCL 25 MG PO CAPS: 25.0000 mg | ORAL_CAPSULE | ORAL | Status: DC | PRN

## 2011-12-02 MED ORDER — HYDROCORTISONE 1 % EX CREA
TOPICAL_CREAM | Freq: Two times a day (BID) | CUTANEOUS | Status: DC
  Administered 2011-12-02 (×2): via TOPICAL
  Filled 2011-12-02: qty 28

## 2011-12-02 MED ORDER — DIBUCAINE 1 % RE OINT: 1.0000 "application " | TOPICAL_OINTMENT | RECTAL | Status: DC | PRN

## 2011-12-02 MED ORDER — DIPHENHYDRAMINE HCL 50 MG/ML IJ SOLN: 12.5000 mg | INTRAMUSCULAR | Status: DC | PRN

## 2011-12-02 MED ORDER — SENNOSIDES-DOCUSATE SODIUM 8.6-50 MG PO TABS
2.0000 | ORAL_TABLET | Freq: Every day | ORAL | Status: DC
  Administered 2011-12-02: 2 via ORAL

## 2011-12-02 MED ORDER — BISACODYL 10 MG RE SUPP: 10.0000 mg | Freq: Every day | RECTAL | Status: DC | PRN

## 2011-12-02 MED ORDER — DIPHENHYDRAMINE HCL 50 MG/ML IJ SOLN: 25.0000 mg | INTRAMUSCULAR | Status: DC | PRN

## 2011-12-02 MED ORDER — TETANUS-DIPHTH-ACELL PERTUSSIS 5-2.5-18.5 LF-MCG/0.5 IM SUSP: 0.5000 mL | Freq: Once | INTRAMUSCULAR | Status: DC

## 2011-12-02 MED ORDER — FLEET ENEMA 7-19 GM/118ML RE ENEM: 1.0000 | ENEMA | Freq: Every day | RECTAL | Status: DC | PRN

## 2011-12-02 MED ORDER — SODIUM CHLORIDE 0.9 % IV SOLN
250.0000 mL | INTRAVENOUS | Status: DC
Start: 2011-12-02 — End: 2011-12-04

## 2011-12-02 MED ORDER — OXYTOCIN 20 UNITS IN LACTATED RINGERS INFUSION - SIMPLE: 125.0000 mL/h | INTRAVENOUS | Status: AC

## 2011-12-02 MED ORDER — METOCLOPRAMIDE HCL 5 MG/ML IJ SOLN
10.0000 mg | Freq: Three times a day (TID) | INTRAMUSCULAR | Status: DC | PRN
  Administered 2011-12-02: 10 mg via INTRAVENOUS
  Filled 2011-12-02: qty 2

## 2011-12-02 MED ORDER — NALBUPHINE HCL 10 MG/ML IJ SOLN
5.0000 mg | INTRAMUSCULAR | Status: DC | PRN
  Filled 2011-12-02: qty 1

## 2011-12-02 MED ORDER — MENTHOL 3 MG MT LOZG: 1.0000 | LOZENGE | OROMUCOSAL | Status: DC | PRN

## 2011-12-02 MED ORDER — CEFAZOLIN SODIUM-DEXTROSE 2-3 GM-% IV SOLR: 2.0000 g | INTRAVENOUS | Status: DC

## 2011-12-02 MED ORDER — SODIUM CHLORIDE 0.9 % IV SOLN
1.0000 ug/kg/h | INTRAVENOUS | Status: DC | PRN
  Filled 2011-12-02: qty 2.5

## 2011-12-02 MED ORDER — WITCH HAZEL-GLYCERIN EX PADS: 1.0000 "application " | MEDICATED_PAD | CUTANEOUS | Status: DC | PRN

## 2011-12-02 MED ORDER — OXYCODONE-ACETAMINOPHEN 5-325 MG PO TABS
1.0000 | ORAL_TABLET | ORAL | Status: DC | PRN
  Administered 2011-12-02 (×2): 1 via ORAL
  Administered 2011-12-03 (×2): 2 via ORAL
  Administered 2011-12-03: 1 via ORAL
  Administered 2011-12-03 – 2011-12-04 (×3): 2 via ORAL
  Filled 2011-12-02: qty 1
  Filled 2011-12-02 (×6): qty 2
  Filled 2011-12-02: qty 1

## 2011-12-02 MED ORDER — PRENATAL MULTIVITAMIN CH
1.0000 | ORAL_TABLET | Freq: Every day | ORAL | Status: DC
  Administered 2011-12-02 – 2011-12-04 (×3): 1 via ORAL
  Filled 2011-12-02 (×3): qty 1

## 2011-12-02 MED ORDER — SODIUM CHLORIDE 0.9 % IJ SOLN: 3.0000 mL | Freq: Two times a day (BID) | INTRAMUSCULAR | Status: DC

## 2011-12-02 MED ORDER — ZOLPIDEM TARTRATE 5 MG PO TABS: 5.0000 mg | ORAL_TABLET | Freq: Every evening | ORAL | Status: DC | PRN

## 2011-12-02 MED ORDER — ONDANSETRON HCL 4 MG/2ML IJ SOLN: 4.0000 mg | INTRAMUSCULAR | Status: DC | PRN

## 2011-12-02 MED ORDER — HYDROMORPHONE HCL 2 MG PO TABS
4.0000 mg | ORAL_TABLET | ORAL | Status: DC | PRN
  Administered 2011-12-02: 2 mg via ORAL
  Administered 2011-12-03: 4 mg via ORAL
  Filled 2011-12-02: qty 1
  Filled 2011-12-02: qty 2

## 2011-12-02 MED ORDER — SIMETHICONE 80 MG PO CHEW
80.0000 mg | CHEWABLE_TABLET | Freq: Three times a day (TID) | ORAL | Status: DC
  Administered 2011-12-02 – 2011-12-04 (×8): 80 mg via ORAL

## 2011-12-02 MED ORDER — LANOLIN HYDROUS EX OINT: 1.0000 "application " | TOPICAL_OINTMENT | CUTANEOUS | Status: DC | PRN

## 2011-12-02 MED ORDER — DIPHENHYDRAMINE HCL 25 MG PO CAPS: 25.0000 mg | ORAL_CAPSULE | Freq: Four times a day (QID) | ORAL | Status: DC | PRN

## 2011-12-02 MED ORDER — FERROUS SULFATE 325 (65 FE) MG PO TABS
325.0000 mg | ORAL_TABLET | Freq: Two times a day (BID) | ORAL | Status: DC
  Administered 2011-12-02 – 2011-12-04 (×5): 325 mg via ORAL
  Filled 2011-12-02 (×5): qty 1

## 2011-12-02 NOTE — Op Note (Signed)
NAMEPRAIRIE, STENBERG NO.:  [id number]  MEDICAL RECORD NO.:  [id number]  LOCATION:  9123                          FACILITY:  [facility]  PHYSICIAN:  Maxie Better, M.D.DATE OF BIRTH:  03/24/1986  DATE OF PROCEDURE:  12/01/2011 DATE OF DISCHARGE:                              OPERATIVE REPORT   PREOPERATIVE DIAGNOSES: 1. Arrest of dilatation. 2. Postdates. 3. Pregnancy-induced hypertension.  PROCEDURES: 1. Primary cesarean section, Kerr hysterotomy.  POSTOPERATIVE DIAGNOSES: 1. Postdates. 2. Arrest of dilatation. 3. Fetal macrosomia. 4. Pregnancy-induced hypertension. 5. Right occiput posterior presentation.  ANESTHESIA:  Epidural.  SURGEON:  Maxie Better, M.D.  ASSISTANT:  None.  DESCRIPTION OF PROCEDURE:  Under adequate epidural anesthesia, the patient was placed in the supine position with a left lateral tilt.  She was sterilely prepped and draped in usual fashion.  Indwelling Foley catheter was already in place.  A 0.25% Marcaine was injected along the planned Pfannenstiel skin incision site.  Pfannenstiel skin incision was made, carried down to the rectus fascia.  The rectus fascia was opened transversely.  The rectus fascia was then bluntly and sharply dissected off the rectus muscle in superior and inferior fashion.  At that point, the patient expressed some discomfort.  Nesacaine was infiltrated in the field and after appropriate time, the rectus fascia was opened in the midline.  The parietal peritoneum was entered sharply and extended inferiorly and superiorly.  The Alexis self-retaining retractor was then placed.  The lower uterine segment was well developed.  Bladder, peritoneum was opened transversely and the bladder was bluntly dissected off the lower uterine segment.  A curvilinear low transverse uterine incision was then made and extended with bandage scissors.  Copious amniotic fluid was noted.  Subsequent delivery of a live female  from the right occiput posterior presentation was accomplished.  There was a cord around the neck that was reducible.  The baby was bulb suctioned on the abdomen.  Cord was clamped, cut.  The baby was transferred to the awaiting pediatrician who assigned Apgars of 9 and 9 at 1 and 5 minutes. The placenta was manually removed.  Uterine cavity was cleaned of debris.  Uterine incision had no extension.  Uterine incision was closed in 2 layers, the first layer 0 Monocryl running locked stitch, second layer was imbricated using 0 Monocryl suture.  Additional Nesacaine was then infiltrated in the abdomen after the Alexis retractor was removed. Normal tubes and ovaries were noted bilaterally.  Good hemostasis along the incision line was noted.  The abdomen was copiously irrigated and suctioned followed by the Nesacaine being poured in the abdomen for additional pain management.  The parietal peritoneum was closed with 0 Vicryl.  The rectus fascia was closed with 0 Vicryl x2.  The subcutaneous area was irrigated, small bleeders cauterized.  Interrupted 2-0 plain sutures placed and the skin approximated using Ethicon staples.  SPECIMEN:  Placenta not sent to Pathology.  ESTIMATED BLOOD LOSS:  700 mL.  INTRAOPERATIVE FLUID:  1400 mL.  URINE OUTPUT:  200 mL, concentrated urine.  COUNTS:  Sponge and instrument counts x2 was correct.  COMPLICATIONS:  None.  Weight of the baby was 9 lb  5oz.  The patient tolerated the procedure well, was transferred to recovery room in stable condition.     Maxie Better, M.D.     [place]/MEDQ  D:  12/01/2011  T:  12/02/2011  Job:  [id number]

## 2011-12-02 NOTE — Progress Notes (Signed)
Subjective: POD# 1 Information for the patient's newborn:  Jonni, Oel[place] Boy Nalaysia [[id number]]  female  / circ planned  Reports feeling well, some incision pain w/ movement. Denies PEC s/s. Feeding: breast Patient reports tolerating PO.  Breast symptoms: none Pain controlled withnarcotic analgesics including hydromorphone (Dilaudid) (Dilaudid 4 mg PO ordered, can try half dose - does not want to be sleepy) Denies HA/SOB/C/P/N/V/dizziness. Flatus present. She reports vaginal bleeding as normal, without clots.  No ambulation as of yet, foley cath in place.  Objective:   VS:  Filed Vitals:   12/02/11 0400 12/02/11 0402 12/02/11 0559 12/02/11 0816  BP: 118/82 113/82 116/80 126/82  Pulse: 85 80 82 99  Temp: 98.5 F (36.9 C)  98.2 F (36.8 C) 98.7 F (37.1 C)  TempSrc: Oral  Oral Oral  Resp: 18 18 18 18   Height:      Weight:      SpO2: 96%  96% 98%     Intake/Output Summary (Last 24 hours) at 12/02/11 0900 Last data filed at 12/02/11 0505  Gross per 24 hour  Intake   2810 ml  Output   3100 ml  Net   -290 ml        Basename 12/02/11 0555 12/01/11 1429  WBC 17.7* 13.9*  HGB 8.5* 9.8*  HCT 26.9* 31.4*  PLT 190 177     Blood type: --/--/O POS (03/01 1429)  Rubella: Immune (07/26 0000)     Physical Exam:  General: alert, cooperative and no distress CV: Regular rate and rhythm Resp: clear Abdomen: soft, nontender, normal bowel sounds, erythema / macular rash in shape of OR drape Incision: clean, dry, intact and dressing on LST Uterine Fundus: firm, below umbilicus, nontender Lochia: minimal Ext: Homans sign is negative, no sign of DVT and no edema, redness or tenderness in the calves or thighs      Assessment/Plan: 26 y.o.   POD# 1.  s/p Cesarean Delivery.  Indications: failure to progress                Principal Problem:  *PP care - s/p 1C/S  Active Problems: PIH - delivered, no evidence of PEC  BP stable on Labetalol   Maternal iron deficiency  anemia  Asymptomatic, on oral Fe Contact dermatitis - reaction to adhesive in surgical drape  wipe skin on abdomen well, hydrocortisone cream BID to site  Doing well, stable post-op.            Advance diet as tolerated D/C foley  Ambulate Routine post-op care  Zenovia Justman 12/02/2011, 9:00 AM

## 2011-12-02 NOTE — Addendum Note (Signed)
Addendum  created 12/02/11 2031 by Len Blalock, CRNA   Modules edited:Charges VN, Notes Section

## 2011-12-02 NOTE — Procedures (Signed)
Consent signed and on chart. 1.45 cm Gomco circ used. After baby placed in [place]. Bleeding noted gelfoam removed adrenaline and pressure used. 2 separate bleeders noted. Silver nitrate used and additional adrenaline used with hemostasis acheived

## 2011-12-02 NOTE — Anesthesia Postprocedure Evaluation (Signed)
  Anesthesia Post-op Note  Patient: Christine Castro  Procedure(s) Performed: Procedure(s) (LRB): CESAREAN SECTION (N/A)  Patient Location: PACU and Mother/Baby  Anesthesia Type: Epidural  Level of Consciousness: awake, alert  and oriented  Airway and Oxygen Therapy: Patient Spontanous Breathing   Post-op Assessment: Patient's Cardiovascular Status Stable and Respiratory Function Stable  Post-op Vital Signs: stable  Complications: No apparent anesthesia complications

## 2011-12-03 NOTE — Progress Notes (Signed)
Subjective: POD# 2 Information for the patient's newborn:  Christine Castro, Christine Castro [[id number]]  female  / circ done  Reports feeling tired, up at night with feeds. Feeding: breast Patient reports tolerating PO.  Breast symptoms: colostrum present Pain controlled with Percocet Denies HA/SOB/C/P/N/V/dizziness. Flatus present. She reports vaginal bleeding as normal, without clots.  She is ambulating, urinating without difficult.     Objective:   VS:  Filed Vitals:   12/02/11 2000 12/02/11 2200 12/02/11 2300 12/03/11 0706  BP: 114/76 114/79 127/83 112/75  Pulse: 90 89 110 96  Temp: 98.4 F (36.9 C)  98.5 F (36.9 C) 98.6 F (37 C)  TempSrc: Oral  Oral Oral  Resp: 18  20 20   Height:      Weight:      SpO2: 97%  100%      Intake/Output Summary (Last 24 hours) at 12/03/11 0909 Last data filed at 12/02/11 1600  Gross per 24 hour  Intake      0 ml  Output   2200 ml  Net  -2200 ml        Basename 12/02/11 0555 12/01/11 1429  WBC 17.7* 13.9*  HGB 8.5* 9.8*  HCT 26.9* 31.4*  PLT 190 177     Blood type: --/--/O POS (03/01 1429)  Rubella: Immune (07/26 0000)     Physical Exam:  General: alert, cooperative and no distress CV: Regular rate and rhythm Resp: clear Abdomen: soft, nontender, normal bowel sounds Incision: clean, dry, intact and staples in place Uterine Fundus: firm, below umbilicus, nontender Lochia: minimal Ext: edema +1 and Homans sign is negative, no sign of DVT      Assessment/Plan: 26 y.o.   POD# 2.  s/p Cesarean Delivery.  Indications: failure to progress                Principal Problem:  *PP care - s/p 1C/S  Doing well, stable.   Active Problems:  Maternal iron deficiency anemia  Continue oral Fe  Gest HTN w/o evidence of PEC - delivered, good diuresis   BP 110's/70's  Plan d/c labetalol, continue to monitor BP            Ambulate Routine post-op care Anticipate discharge home in AM.   Christine Castro 12/03/2011, 9:09 AM

## 2011-12-04 ENCOUNTER — Encounter (HOSPITAL_COMMUNITY): Payer: Self-pay | Admitting: Obstetrics and Gynecology

## 2011-12-04 MED ORDER — FERRALET 90 90-1 MG PO TABS: 1.0000 | ORAL_TABLET | Freq: Every day | ORAL | Status: DC

## 2011-12-04 MED ORDER — OXYCODONE-ACETAMINOPHEN 5-325 MG PO TABS: 1.0000 | ORAL_TABLET | ORAL | Status: AC | PRN

## 2011-12-04 NOTE — Discharge Summary (Signed)
POSTOPERATIVE DISCHARGE SUMMARY:  Patient ID: Christine Castro MRN: [id number] DOB/AGE: 26/28/1987 25 y.o.  Admit date: 11/30/2011 Discharge date:  12/04/2011   Admission Diagnoses: 1. Post dates pregnancy for induction of labor 2. Mild gestational hypertension  Discharge Diagnoses:  1. Post dates Pregnancy-delivered 2. Post operative day 3, status post cesarean section for arrest of dilation  Prenatal history: G1P1001   EDC : 11/29/2011, Alternate EDD Entry  Prenatal care at [facility] since [redacted] weeks gestation  Prenatal course complicated by gestational hypertension, PCOS, tricuspid regurgitation with normal cardiac work up in pregnancy, LGSIL pap  Prenatal Labs: ABO, Rh: O (07/26 0000)  Antibody: Negative (07/26 0000) Rubella: Immune (07/26 0000)  RPR: NON REACTIVE (02/28 2000)  HBsAg: Negative (07/26 0000)  HIV: Non-reactive (07/26 0000)  GBS: Negative (02/05 0000)  1 hr Glucola : 101   Medical / Surgical History :  Past medical history:  Past Medical History  Diagnosis Date  . Sciatica   . GERD (gastroesophageal reflux disease)   . Polycystic ovaries   . Hirsutism   . Abnormal Pap smear   . Other seborrheic keratosis   . Migraine, unspecified, without mention of intractable migraine without mention of status migrainosus   . History of chicken pox   . Diseases of tricuspid valve   . IBS (irritable bowel syndrome)   . Right bundle branch block     incomplete, assumed congenital,   . PP care - s/p 1C/S 12/02/2011  . Maternal iron deficiency anemia 12/02/2011    Past surgical history:  Past Surgical History  Procedure Date  . No past surgeries   . Wisdom tooth extraction   . Colonoscopy     Family History:  Family History  Problem Relation Age of Onset  . Peripheral vascular disease Mother   . Migraines Mother   . Depression Mother   . Crohn's disease Mother   . Eczema Mother   . Rheum arthritis Father     juvenile  . Peripheral vascular  disease Maternal Grandmother   . Depression Maternal Grandmother   . Hypertension Maternal Grandmother   . Diverticulitis Maternal Grandmother   . Hypertension Maternal Grandfather   . Stroke Maternal Grandfather   . Cancer Maternal Grandfather     bone, prostate, melanoma  . Peripheral vascular disease Paternal Grandmother   . Hypertension Paternal Grandmother   . Stroke Paternal Grandmother   . COPD Paternal Grandmother   . Heart disease Paternal Grandmother   . Muscular dystrophy Cousin     Social History:  reports that she has never smoked. She has never used smokeless tobacco. She reports that she does not drink alcohol or use illicit drugs.   Allergies: Aspirin; Darvocet; and Gloves    Current Medications at time of admission:  Prenatal vitamins, pantoprazole,  Fioricet   Admit labs: Results for Christine Castro (MRN [id number]) as of 12/04/2011 17:02  12/01/2011 14:29  Uric Acid, Serum 5.0  AST 15  ALT 12  WBC 13.9 (H)  RBC 4.00  HGB 9.8 (L)  HCT 31.4 (L)  Platelets 177    Intrapartum Course: Patient was admitted overnight for cervical ripening with cytotec. She was started on Pitocin IV in the morning, received an epidural for pain control, and progressed to a dilation of 6 cm, then arrested for several hours despite adequate contractions per intrauterine pressure catheter. Suspect macrosomia and persistent occiput posterior presentation, patient was counseled for cesarean section.  Procedures: Cesarean section delivery of female newborn  by Dr Cherly Hensen.  See operative report for further details  Postoperative / postpartum course:  Uncomplicated. Patient recovered well, labs showed no evidence of pre-eclampsia and blood pressure normalized without medical aid and experienced good diuresis. Acute blood loss anemia superimposed on maternal iron deficiency anemia was asymptomatic, patient was started on oral iron supplement.   Physical Exam:  VSS: Blood pressure 114/77, pulse  83, temperature 98.5 F (36.9 C), temperature source Oral, resp. rate 18, height 5\' 4"  (1.626 m), weight 86.183 kg (190 lb), SpO2 100.00%, unknown if currently breastfeeding.   LABS:  Basename 12/02/11 0555 12/01/11 1429  WBC 17.7* 13.9*  HGB 8.5* 9.8*  HCT 26.9* 31.4*  PLT 190 177     Incision:  approximated with staples / no erythema / no ecchymosis / no drainage Staples:  removed prior to discharge and replaced with benzoin and steri strips.   Discharge Instructions:  Discharged Condition: good Activity: pelvic rest and weight lifting precautions x 2 weeks Diet: routine Medications:  Medication List  As of 12/04/2011 10:20 AM   TAKE these medications         CONCEPT DHA 53.5-38-1 MG Caps   Take 1 tablet by mouth daily.      famotidine 10 MG tablet   Commonly known as: PEPCID   Take 10 mg by mouth 2 (two) times daily.      Ferralet 90 90-1 MG Tabs   Take 1 tablet by mouth daily. Take on empty stomach between meals with cup of orange juice or Vit. C 250 mg.  Do not take with dairy products or calcium.      FIORICET 50-325-40 MG per tablet   Generic drug: butalbital-acetaminophen-caffeine   Take 1 tablet by mouth 2 (two) times daily as needed. For migraine      oxyCODONE-acetaminophen 5-325 MG per tablet   Commonly known as: PERCOCET   Take 1-2 tablets by mouth every 4 (four) hours as needed.      TUMS PO   Take 1 tablet by mouth daily.           Condition: stable Postpartum Instructions: refer to practice specific booklet Discharge to: home Disposition: 01-Home or Self Care Follow up :  Follow-up Information    Follow up with Lenoard Aden, MD. Schedule an appointment as soon as possible for a visit in 6 weeks.   Contact information:   [address] [place] [address] [phone]       Follow up with [facility]. Schedule an appointment as soon as possible for a visit in 1 week. (for BP check)            Signed: Karl Knarr 12/04/2011, 10:20 AM

## 2011-12-04 NOTE — Progress Notes (Signed)
Subjective: POD# 3 Information for the patient's newborn:  Rickie, Gutierres Boy Luverne [[id number]]  female  / circ done  Reports feeling tired, cluster feeding through the night, increased colostrum and improved latch with nipple shields Feeding: breast Patient reports tolerating PO.  Breast symptoms: sore nipples Pain controlled with Percocet. Denies HA/SOB/C/P/N/V/dizziness. Flatus present, (+) BM. She reports vaginal bleeding as normal, without clots.  She is ambulating, urinating without difficult.     Objective:   VS:  Filed Vitals:   12/03/11 0706 12/03/11 1515 12/03/11 2153 12/04/11 0549  BP: 112/75 121/83 121/80 114/77  Pulse: 96 101 98 83  Temp: 98.6 F (37 C) 98 F (36.7 C) 98.2 F (36.8 C) 98.5 F (36.9 C)  TempSrc: Oral Oral Oral Oral  Resp: 20 18 18 18   Height:      Weight:      SpO2:        No intake or output data in the 24 hours ending 12/04/11 1012      Basename 12/02/11 0555 12/01/11 1429  WBC 17.7* 13.9*  HGB 8.5* 9.8*  HCT 26.9* 31.4*  PLT 190 177     Blood type: --/--/O POS (03/01 1429)  Rubella: Immune (07/26 0000)     Physical Exam:  General: alert, cooperative and no distress CV: Regular rate and rhythm Resp: clear Abdomen: soft, nontender, normal bowel sounds Incision: clean, dry, intact and staples in place Uterine Fundus: firm, below umbilicus, nontender Lochia: minimal Ext: edema +1, neg Homan's      Assessment/Plan: 26 y.o.   POD# 3.  s/p Cesarean Delivery.  Indications: failure to progress                Principal Problem:  *PP care - s/p 1C/S Active Problems:  Maternal iron deficiency anemia Gestational HTN - delivered, no evidence of PEC  Oral Fe x 4-6 wks Doing well, stable.    Routine post-op care D/c home w/ instructions  Lisaanne Lawrie 12/04/2011, 10:12 AM

## 2011-12-04 NOTE — Discharge Instructions (Signed)

## 2011-12-04 NOTE — Progress Notes (Signed)
UR chart review completed.  

## 2013-09-04 LAB — OB RESULTS CONSOLE HEPATITIS B SURFACE ANTIGEN: Hepatitis B Surface Ag: NEGATIVE

## 2013-09-04 LAB — OB RESULTS CONSOLE ABO/RH: RH TYPE: POSITIVE

## 2013-09-04 LAB — OB RESULTS CONSOLE ANTIBODY SCREEN: ANTIBODY SCREEN: NEGATIVE

## 2013-09-04 LAB — OB RESULTS CONSOLE HIV ANTIBODY (ROUTINE TESTING): HIV: NONREACTIVE

## 2013-09-04 LAB — OB RESULTS CONSOLE RPR: RPR: NONREACTIVE

## 2013-09-04 LAB — OB RESULTS CONSOLE RUBELLA ANTIBODY, IGM: Rubella: IMMUNE

## 2014-03-04 ENCOUNTER — Other Ambulatory Visit: Payer: Self-pay | Admitting: Obstetrics and Gynecology

## 2014-03-27 ENCOUNTER — Encounter (HOSPITAL_COMMUNITY): Payer: Self-pay | Admitting: Pharmacy Technician

## 2014-04-06 ENCOUNTER — Encounter (HOSPITAL_COMMUNITY): Payer: Self-pay

## 2014-04-06 NOTE — Patient Instructions (Addendum)
   Your procedure is scheduled on:  Wednesday, July 8  [facility] at:  715 AM Pick up the phone at the desk and dial [phone] and inform us of your arrival.  Please call this number if you have any problems the morning of surgery: [phone]  Remember: Do not eat or drink after midnight: Tuesday Take these medicines the morning of surgery with a SIP OF WATER:  None  Do not wear jewelry, make-up, or FINGER nail polish No metal in your hair or on your body. Do not wear lotions, powders, perfumes.  You may wear deodorant.  Do not bring valuables to the hospital. Contacts, dentures or bridgework may not be worn into surgery.  Leave suitcase in the car. After Surgery it may be brought to your room. For patients being admitted to the hospital, checkout time is 11:00am the day of discharge.  Home with husband Christine PacasStephan cell [phone].

## 2014-04-06 NOTE — Patient Instructions (Signed)
   Your procedure is scheduled on:  Wednesday, July 8  [facility] at:  845 AM Pick up the phone at the desk and dial [phone] and inform us of your arrival.  Please call this number if you have any problems the morning of surgery: [phone]  Remember: Do not eat or  Drink after midnight: Tuesday Take these medicines the morning of surgery with a SIP OF WATER:  Do not wear jewelry, make-up, or FINGER nail polish No metal in your hair or on your body. Do not wear lotions, powders, perfumes.  You may wear deodorant.  Do not bring valuables to the hospital. Contacts, dentures or bridgework may not be worn into surgery.  Leave suitcase in the car. After Surgery it may be brought to your room. For patients being admitted to the hospital, checkout time is 11:00am the day of discharge.

## 2014-04-07 ENCOUNTER — Encounter (HOSPITAL_COMMUNITY)
Admission: RE | Admit: 2014-04-07 | Discharge: 2014-04-07 | Disposition: A | Discharge status: Home / Self Care | Payer: Managed Care, Other (non HMO) | Source: Ambulatory Visit | Attending: Obstetrics and Gynecology | Admitting: Obstetrics and Gynecology

## 2014-04-07 ENCOUNTER — Encounter (HOSPITAL_COMMUNITY): Payer: Self-pay

## 2014-04-07 HISTORY — DX: Other specified pregnancy related conditions, unspecified trimester: O26.899

## 2014-04-07 HISTORY — DX: Heartburn: R12

## 2014-04-07 LAB — CBC
HCT: 32.7 % — ABNORMAL LOW (ref 36.0–46.0)
Hemoglobin: 10.5 g/dL — ABNORMAL LOW (ref 12.0–15.0)
MCH: 26.3 pg (ref 26.0–34.0)
MCHC: 32.1 g/dL (ref 30.0–36.0)
MCV: 81.8 fL (ref 78.0–100.0)
PLATELETS: 159 10*3/uL (ref 150–400)
RBC: 4 MIL/uL (ref 3.87–5.11)
RDW: 14.4 % (ref 11.5–15.5)
WBC: 9.3 10*3/uL (ref 4.0–10.5)

## 2014-04-07 LAB — RPR

## 2014-04-07 NOTE — Pre-Procedure Instructions (Signed)
Per office pt was inst to arrive at 0715 on 04/08/14 for surgery.

## 2014-04-08 ENCOUNTER — Inpatient Hospital Stay (HOSPITAL_COMMUNITY)
Admission: AD | Admit: 2014-04-08 | Discharge: 2014-04-11 | DRG: 765 | Disposition: A | Discharge status: Home / Self Care | Payer: Managed Care, Other (non HMO) | Source: Ambulatory Visit | Attending: Obstetrics and Gynecology | Admitting: Obstetrics and Gynecology

## 2014-04-08 ENCOUNTER — Encounter (HOSPITAL_COMMUNITY): Payer: Self-pay | Admitting: Anesthesiology

## 2014-04-08 ENCOUNTER — Encounter (HOSPITAL_COMMUNITY): Payer: Managed Care, Other (non HMO) | Admitting: Anesthesiology

## 2014-04-08 ENCOUNTER — Inpatient Hospital Stay (HOSPITAL_COMMUNITY): Payer: Managed Care, Other (non HMO) | Admitting: Anesthesiology

## 2014-04-08 ENCOUNTER — Encounter (HOSPITAL_COMMUNITY)
Admission: AD | Disposition: A | Discharge status: Home / Self Care | Payer: Self-pay | Source: Ambulatory Visit | Attending: Obstetrics and Gynecology

## 2014-04-08 DIAGNOSIS — K589 Irritable bowel syndrome without diarrhea: Secondary | ICD-10-CM | POA: Diagnosis present

## 2014-04-08 DIAGNOSIS — A6000 Herpesviral infection of urogenital system, unspecified: Secondary | ICD-10-CM | POA: Diagnosis present

## 2014-04-08 DIAGNOSIS — L821 Other seborrheic keratosis: Secondary | ICD-10-CM | POA: Diagnosis present

## 2014-04-08 DIAGNOSIS — O409XX0 Polyhydramnios, unspecified trimester, not applicable or unspecified: Secondary | ICD-10-CM | POA: Diagnosis present

## 2014-04-08 DIAGNOSIS — B009 Herpesviral infection, unspecified: Secondary | ICD-10-CM | POA: Diagnosis not present

## 2014-04-08 DIAGNOSIS — K219 Gastro-esophageal reflux disease without esophagitis: Secondary | ICD-10-CM | POA: Diagnosis present

## 2014-04-08 DIAGNOSIS — D62 Acute posthemorrhagic anemia: Secondary | ICD-10-CM | POA: Diagnosis present

## 2014-04-08 DIAGNOSIS — N736 Female pelvic peritoneal adhesions (postinfective): Secondary | ICD-10-CM | POA: Diagnosis present

## 2014-04-08 DIAGNOSIS — O99892 Other specified diseases and conditions complicating childbirth: Secondary | ICD-10-CM | POA: Diagnosis present

## 2014-04-08 DIAGNOSIS — L680 Hirsutism: Secondary | ICD-10-CM | POA: Diagnosis present

## 2014-04-08 DIAGNOSIS — O9902 Anemia complicating childbirth: Secondary | ICD-10-CM | POA: Diagnosis present

## 2014-04-08 DIAGNOSIS — O9989 Other specified diseases and conditions complicating pregnancy, childbirth and the puerperium: Secondary | ICD-10-CM

## 2014-04-08 DIAGNOSIS — I4510 Unspecified right bundle-branch block: Secondary | ICD-10-CM | POA: Diagnosis present

## 2014-04-08 DIAGNOSIS — O98519 Other viral diseases complicating pregnancy, unspecified trimester: Secondary | ICD-10-CM | POA: Diagnosis present

## 2014-04-08 DIAGNOSIS — O34219 Maternal care for unspecified type scar from previous cesarean delivery: Principal | ICD-10-CM | POA: Diagnosis present

## 2014-04-08 LAB — PREPARE RBC (CROSSMATCH)

## 2014-04-08 SURGERY — Surgical Case
Anesthesia: Spinal

## 2014-04-08 MED ORDER — NALBUPHINE HCL 10 MG/ML IJ SOLN
5.0000 mg | INTRAMUSCULAR | Status: DC | PRN
  Administered 2014-04-08: 10 mg via INTRAVENOUS
  Filled 2014-04-08: qty 1

## 2014-04-08 MED ORDER — PROMETHAZINE HCL 25 MG/ML IJ SOLN: 6.2500 mg | INTRAMUSCULAR | Status: DC | PRN

## 2014-04-08 MED ORDER — MEPERIDINE HCL 25 MG/ML IJ SOLN: 6.2500 mg | INTRAMUSCULAR | Status: DC | PRN

## 2014-04-08 MED ORDER — CEFAZOLIN SODIUM-DEXTROSE 2-3 GM-% IV SOLR
INTRAVENOUS | Status: AC
  Filled 2014-04-08: qty 50

## 2014-04-08 MED ORDER — DIBUCAINE 1 % RE OINT: 1.0000 "application " | TOPICAL_OINTMENT | RECTAL | Status: DC | PRN

## 2014-04-08 MED ORDER — ONDANSETRON HCL 4 MG PO TABS: 4.0000 mg | ORAL_TABLET | ORAL | Status: DC | PRN

## 2014-04-08 MED ORDER — DEXTROSE 5 % IV SOLN
1.0000 ug/kg/h | INTRAVENOUS | Status: DC | PRN
  Filled 2014-04-08: qty 2

## 2014-04-08 MED ORDER — CEFAZOLIN SODIUM-DEXTROSE 2-3 GM-% IV SOLR
2.0000 g | INTRAVENOUS | Status: AC
  Administered 2014-04-08: 2 g via INTRAVENOUS

## 2014-04-08 MED ORDER — PHENYLEPHRINE 8 MG IN D5W 100 ML (0.08MG/ML) PREMIX OPTIME
INJECTION | INTRAVENOUS | Status: DC | PRN
  Administered 2014-04-08: 60 ug/min via INTRAVENOUS

## 2014-04-08 MED ORDER — ONDANSETRON HCL 4 MG/2ML IJ SOLN
INTRAMUSCULAR | Status: AC
  Filled 2014-04-08: qty 2

## 2014-04-08 MED ORDER — FERROUS SULFATE 325 (65 FE) MG PO TABS
325.0000 mg | ORAL_TABLET | Freq: Two times a day (BID) | ORAL | Status: DC
  Administered 2014-04-08 – 2014-04-11 (×6): 325 mg via ORAL
  Filled 2014-04-08 (×6): qty 1

## 2014-04-08 MED ORDER — ONDANSETRON HCL 4 MG/2ML IJ SOLN: 4.0000 mg | Freq: Three times a day (TID) | INTRAMUSCULAR | Status: DC | PRN

## 2014-04-08 MED ORDER — DIPHENHYDRAMINE HCL 25 MG PO CAPS
25.0000 mg | ORAL_CAPSULE | ORAL | Status: DC | PRN
  Filled 2014-04-08 (×2): qty 1

## 2014-04-08 MED ORDER — PROMETHAZINE HCL 25 MG/ML IJ SOLN
INTRAMUSCULAR | Status: AC
  Filled 2014-04-08: qty 1

## 2014-04-08 MED ORDER — LANOLIN HYDROUS EX OINT: 1.0000 "application " | TOPICAL_OINTMENT | CUTANEOUS | Status: DC | PRN

## 2014-04-08 MED ORDER — FENTANYL CITRATE 0.05 MG/ML IJ SOLN
50.0000 ug | INTRAMUSCULAR | Status: DC | PRN
  Administered 2014-04-08: 100 ug via INTRAVENOUS

## 2014-04-08 MED ORDER — BUPIVACAINE HCL (PF) 0.25 % IJ SOLN
INTRAMUSCULAR | Status: DC | PRN
  Administered 2014-04-08: 6 mL

## 2014-04-08 MED ORDER — OXYTOCIN 40 UNITS IN LACTATED RINGERS INFUSION - SIMPLE MED: 62.5000 mL/h | INTRAVENOUS | Status: AC

## 2014-04-08 MED ORDER — LACTATED RINGERS IV SOLN
INTRAVENOUS | Status: DC | PRN
  Administered 2014-04-08: 09:00:00 via INTRAVENOUS

## 2014-04-08 MED ORDER — ZOLPIDEM TARTRATE 5 MG PO TABS: 5.0000 mg | ORAL_TABLET | Freq: Every evening | ORAL | Status: DC | PRN

## 2014-04-08 MED ORDER — PRENATAL MULTIVITAMIN CH
1.0000 | ORAL_TABLET | Freq: Every day | ORAL | Status: DC
  Administered 2014-04-09 – 2014-04-10 (×2): 1 via ORAL
  Filled 2014-04-08 (×2): qty 1

## 2014-04-08 MED ORDER — MEASLES, MUMPS & RUBELLA VAC ~~LOC~~ INJ
0.5000 mL | INJECTION | Freq: Once | SUBCUTANEOUS | Status: DC
  Filled 2014-04-08: qty 0.5

## 2014-04-08 MED ORDER — SODIUM CHLORIDE 0.9 % IJ SOLN: 3.0000 mL | INTRAMUSCULAR | Status: DC | PRN

## 2014-04-08 MED ORDER — MORPHINE SULFATE (PF) 0.5 MG/ML IJ SOLN
INTRAMUSCULAR | Status: DC | PRN
  Administered 2014-04-08: .2 mg via INTRATHECAL

## 2014-04-08 MED ORDER — BUPIVACAINE HCL (PF) 0.25 % IJ SOLN
INTRAMUSCULAR | Status: AC
  Filled 2014-04-08: qty 30

## 2014-04-08 MED ORDER — HYDROMORPHONE HCL PF 1 MG/ML IJ SOLN: 0.2500 mg | INTRAMUSCULAR | Status: DC | PRN

## 2014-04-08 MED ORDER — FLEET ENEMA 7-19 GM/118ML RE ENEM: 1.0000 | ENEMA | Freq: Every day | RECTAL | Status: DC | PRN

## 2014-04-08 MED ORDER — ONDANSETRON HCL 4 MG/2ML IJ SOLN
INTRAMUSCULAR | Status: DC | PRN
  Administered 2014-04-08: 4 mg via INTRAVENOUS

## 2014-04-08 MED ORDER — BISACODYL 10 MG RE SUPP: 10.0000 mg | Freq: Every day | RECTAL | Status: DC | PRN

## 2014-04-08 MED ORDER — HYDROMORPHONE HCL 2 MG PO TABS
4.0000 mg | ORAL_TABLET | ORAL | Status: DC | PRN
  Administered 2014-04-08: 2 mg via ORAL
  Filled 2014-04-08: qty 2

## 2014-04-08 MED ORDER — HYDROMORPHONE HCL 2 MG PO TABS
2.0000 mg | ORAL_TABLET | ORAL | Status: DC | PRN
  Administered 2014-04-09: 2 mg via ORAL
  Filled 2014-04-08: qty 1

## 2014-04-08 MED ORDER — FENTANYL CITRATE 0.05 MG/ML IJ SOLN
INTRAMUSCULAR | Status: DC | PRN
  Administered 2014-04-08: 25 ug via INTRAVENOUS
  Administered 2014-04-08: 12.5 ug via INTRAVENOUS
  Administered 2014-04-08: 12.5 ug via INTRATHECAL

## 2014-04-08 MED ORDER — MENTHOL 3 MG MT LOZG: 1.0000 | LOZENGE | OROMUCOSAL | Status: DC | PRN

## 2014-04-08 MED ORDER — SENNOSIDES-DOCUSATE SODIUM 8.6-50 MG PO TABS
2.0000 | ORAL_TABLET | ORAL | Status: DC
  Administered 2014-04-10 (×2): 2 via ORAL
  Filled 2014-04-08 (×3): qty 2

## 2014-04-08 MED ORDER — FENTANYL CITRATE 0.05 MG/ML IJ SOLN
INTRAMUSCULAR | Status: AC
  Administered 2014-04-08: 100 ug via INTRAVENOUS
  Filled 2014-04-08: qty 2

## 2014-04-08 MED ORDER — SODIUM CHLORIDE 0.9 % IV SOLN: 250.0000 mL | INTRAVENOUS | Status: DC

## 2014-04-08 MED ORDER — PHENYLEPHRINE HCL 10 MG/ML IJ SOLN
INTRAMUSCULAR | Status: AC
  Filled 2014-04-08: qty 1

## 2014-04-08 MED ORDER — LACTATED RINGERS IV SOLN
Freq: Once | INTRAVENOUS | Status: AC
  Administered 2014-04-08: 08:00:00 via INTRAVENOUS

## 2014-04-08 MED ORDER — DIPHENHYDRAMINE HCL 25 MG PO CAPS: 25.0000 mg | ORAL_CAPSULE | Freq: Four times a day (QID) | ORAL | Status: DC | PRN

## 2014-04-08 MED ORDER — OXYTOCIN 10 UNIT/ML IJ SOLN
INTRAMUSCULAR | Status: AC
  Filled 2014-04-08: qty 4

## 2014-04-08 MED ORDER — SODIUM CHLORIDE 0.9 % IJ SOLN: 3.0000 mL | Freq: Two times a day (BID) | INTRAMUSCULAR | Status: DC

## 2014-04-08 MED ORDER — ACETAMINOPHEN 500 MG PO TABS: 1000.0000 mg | ORAL_TABLET | Freq: Four times a day (QID) | ORAL | Status: DC | PRN

## 2014-04-08 MED ORDER — DIPHENHYDRAMINE HCL 50 MG/ML IJ SOLN: 25.0000 mg | INTRAMUSCULAR | Status: DC | PRN

## 2014-04-08 MED ORDER — OXYTOCIN 10 UNIT/ML IJ SOLN
40.0000 [IU] | INTRAVENOUS | Status: DC | PRN
  Administered 2014-04-08: 40 [IU] via INTRAVENOUS

## 2014-04-08 MED ORDER — MORPHINE SULFATE 0.5 MG/ML IJ SOLN
INTRAMUSCULAR | Status: AC
  Filled 2014-04-08: qty 10

## 2014-04-08 MED ORDER — BUPIVACAINE IN DEXTROSE 0.75-8.25 % IT SOLN
INTRATHECAL | Status: DC | PRN
  Administered 2014-04-08: 1.4 mL via INTRATHECAL

## 2014-04-08 MED ORDER — ONDANSETRON HCL 4 MG/2ML IJ SOLN: 4.0000 mg | INTRAMUSCULAR | Status: DC | PRN

## 2014-04-08 MED ORDER — LACTATED RINGERS IV SOLN
INTRAVENOUS | Status: DC | PRN
  Administered 2014-04-08 (×3): via INTRAVENOUS

## 2014-04-08 MED ORDER — METHYLERGONOVINE MALEATE 0.2 MG PO TABS: 0.2000 mg | ORAL_TABLET | ORAL | Status: DC | PRN

## 2014-04-08 MED ORDER — PROMETHAZINE HCL 25 MG/ML IJ SOLN
6.2500 mg | Freq: Once | INTRAMUSCULAR | Status: AC
  Administered 2014-04-08: 08:00:00 via INTRAVENOUS
  Filled 2014-04-08: qty 1

## 2014-04-08 MED ORDER — METOCLOPRAMIDE HCL 5 MG/ML IJ SOLN
INTRAMUSCULAR | Status: DC | PRN
  Administered 2014-04-08: 10 mg via INTRAVENOUS

## 2014-04-08 MED ORDER — SCOPOLAMINE 1 MG/3DAYS TD PT72
MEDICATED_PATCH | TRANSDERMAL | Status: AC
  Administered 2014-04-08: 1.5 mg
  Filled 2014-04-08: qty 1

## 2014-04-08 MED ORDER — FENTANYL CITRATE 0.05 MG/ML IJ SOLN
INTRAMUSCULAR | Status: AC
  Filled 2014-04-08: qty 2

## 2014-04-08 MED ORDER — NALBUPHINE HCL 10 MG/ML IJ SOLN: 5.0000 mg | INTRAMUSCULAR | Status: DC | PRN

## 2014-04-08 MED ORDER — DIPHENHYDRAMINE HCL 50 MG/ML IJ SOLN
12.5000 mg | INTRAMUSCULAR | Status: DC | PRN
  Administered 2014-04-08: 12.5 mg via INTRAVENOUS
  Filled 2014-04-08: qty 1

## 2014-04-08 MED ORDER — METOCLOPRAMIDE HCL 5 MG/ML IJ SOLN: 10.0000 mg | Freq: Three times a day (TID) | INTRAMUSCULAR | Status: DC | PRN

## 2014-04-08 MED ORDER — WITCH HAZEL-GLYCERIN EX PADS: 1.0000 "application " | MEDICATED_PAD | CUTANEOUS | Status: DC | PRN

## 2014-04-08 MED ORDER — NALOXONE HCL 0.4 MG/ML IJ SOLN: 0.4000 mg | INTRAMUSCULAR | Status: DC | PRN

## 2014-04-08 MED ORDER — METHYLERGONOVINE MALEATE 0.2 MG/ML IJ SOLN: 0.2000 mg | INTRAMUSCULAR | Status: DC | PRN

## 2014-04-08 SURGICAL SUPPLY — 39 items
BARRIER ADHS 3X4 INTERCEED (GAUZE/BANDAGES/DRESSINGS) ×2 IMPLANT
BENZOIN TINCTURE PRP APPL 2/3 (GAUZE/BANDAGES/DRESSINGS) IMPLANT
CLAMP CORD UMBIL (MISCELLANEOUS) IMPLANT
CLOTH BEACON ORANGE TIMEOUT ST (SAFETY) ×2 IMPLANT
CONTAINER PREFILL 10% NBF 15ML (MISCELLANEOUS) IMPLANT
DRAPE LG THREE QUARTER DISP (DRAPES) IMPLANT
DRSG OPSITE POSTOP 4X10 (GAUZE/BANDAGES/DRESSINGS) ×2 IMPLANT
DURAPREP 26ML APPLICATOR (WOUND CARE) ×2 IMPLANT
ELECT REM PT RETURN 9FT ADLT (ELECTROSURGICAL) ×2
ELECTRODE REM PT RTRN 9FT ADLT (ELECTROSURGICAL) ×1 IMPLANT
EXTRACTOR VACUUM M CUP 4 TUBE (SUCTIONS) IMPLANT
GLOVE BIOGEL PI IND STRL 7.0 (GLOVE) ×6 IMPLANT
GLOVE BIOGEL PI INDICATOR 7.0 (GLOVE) ×6
GLOVE ECLIPSE 6.5 STRL STRAW (GLOVE) ×2 IMPLANT
GOWN STRL REUS W/TWL LRG LVL3 (GOWN DISPOSABLE) ×6 IMPLANT
KIT ABG SYR 3ML LUER SLIP (SYRINGE) IMPLANT
NEEDLE HYPO 25X1 1.5 SAFETY (NEEDLE) ×2 IMPLANT
NEEDLE HYPO 25X5/8 SAFETYGLIDE (NEEDLE) IMPLANT
NS IRRIG 1000ML POUR BTL (IV SOLUTION) ×2 IMPLANT
PACK C SECTION WH (CUSTOM PROCEDURE TRAY) ×2 IMPLANT
PAD OB MATERNITY 4.3X12.25 (PERSONAL CARE ITEMS) ×2 IMPLANT
RTRCTR C-SECT PINK 25CM LRG (MISCELLANEOUS) IMPLANT
STAPLER VISISTAT 35W (STAPLE) ×2 IMPLANT
STRIP CLOSURE SKIN 1/2X4 (GAUZE/BANDAGES/DRESSINGS) IMPLANT
SUT CHROMIC GUT AB #0 18 (SUTURE) IMPLANT
SUT MNCRL 0 VIOLET CTX 36 (SUTURE) ×3 IMPLANT
SUT MON AB 4-0 PS1 27 (SUTURE) IMPLANT
SUT MONOCRYL 0 CTX 36 (SUTURE) ×3
SUT PLAIN 2 0 (SUTURE)
SUT PLAIN 2 0 XLH (SUTURE) ×2 IMPLANT
SUT PLAIN ABS 2-0 CT1 27XMFL (SUTURE) IMPLANT
SUT VIC AB 0 CT1 36 (SUTURE) ×4 IMPLANT
SUT VIC AB 2-0 CT1 27 (SUTURE) ×2
SUT VIC AB 2-0 CT1 TAPERPNT 27 (SUTURE) ×2 IMPLANT
SUT VIC AB 4-0 PS2 27 (SUTURE) IMPLANT
SYR CONTROL 10ML LL (SYRINGE) ×2 IMPLANT
TOWEL OR 17X24 6PK STRL BLUE (TOWEL DISPOSABLE) ×2 IMPLANT
TRAY FOLEY CATH 14FR (SET/KITS/TRAYS/PACK) ×2 IMPLANT
WATER STERILE IRR 1000ML POUR (IV SOLUTION) IMPLANT

## 2014-04-08 NOTE — Anesthesia Preprocedure Evaluation (Signed)
Anesthesia Evaluation  Patient identified by MRN, date of birth, ID band Patient awake    Reviewed: Allergy & Precautions, H&P , NPO status , Patient's Chart, lab work & pertinent test results  Airway Mallampati: I TM Distance: >3 FB Neck ROM: full    Dental no notable dental hx.    Pulmonary neg pulmonary ROS,    Pulmonary exam normal       Cardiovascular negative cardio ROS      Neuro/Psych negative psych ROS   GI/Hepatic Neg liver ROS, GERD-  Medicated and Controlled,  Endo/Other  negative endocrine ROS  Renal/GU negative Renal ROS     Musculoskeletal   Abdominal Normal abdominal exam  (+)   Peds  Hematology   Anesthesia Other Findings   Reproductive/Obstetrics (+) Pregnancy                           Anesthesia Physical Anesthesia Plan  ASA: II  Anesthesia Plan: Spinal   Post-op Pain Management:    Induction:   Airway Management Planned:   Additional Equipment:   Intra-op Plan:   Post-operative Plan:   Informed Consent: I have reviewed the patients History and Physical, chart, labs and discussed the procedure including the risks, benefits and alternatives for the proposed anesthesia with the patient or authorized representative who has indicated his/her understanding and acceptance.     Plan Discussed with: CRNA and Surgeon  Anesthesia Plan Comments:         Anesthesia Quick Evaluation

## 2014-04-08 NOTE — Anesthesia Postprocedure Evaluation (Signed)
  Anesthesia Post-op Note  Patient: Christine Castro  Procedure(s) Performed: Procedure(s) with comments: Repeat CESAREAN SECTION; lysis of adhesions (N/A) - EDD: 04/10/14  Patient Location: PACU  Anesthesia Type:Spinal  Level of Consciousness: awake, alert  and oriented  Airway and Oxygen Therapy: Patient Spontanous Breathing  Post-op Pain: none  Post-op Assessment: Post-op Vital signs reviewed, Patient's Cardiovascular Status Stable, Respiratory Function Stable, Patent Airway, No signs of Nausea or vomiting, Pain level controlled, No headache and No backache  Post-op Vital Signs: Reviewed and stable  Complications: No apparent anesthesia complications

## 2014-04-08 NOTE — Addendum Note (Signed)
Addendum created 04/08/14 1912 by Armanda Heritageharlesetta M Graycee Greeson, CRNA   Modules edited: Notes Section   Notes Section:  File: [id number]

## 2014-04-08 NOTE — Progress Notes (Signed)
PT GIVEN 2 MG DILAUDID INSTEAD OF 4 MG DUE TO LOW B/P OF 103/71, AND O2 SAT OF 91-93 PERCENT AT INTERVALS. PT INFORMED IF NO PAIN RELIEF IN ONE HOUR THEN CAN REPEAT DILAUDID TO 2 MG MORE FOR PAIN RELIEF. Gwenlyn FudgeOLETTE DAWSON, CNMW NOTIFIED

## 2014-04-08 NOTE — Progress Notes (Signed)
Pt  assessed at 2045  And vs taken. No documentation noted for vs at 1845 pm for 2 hr vs.

## 2014-04-08 NOTE — Brief Op Note (Signed)
04/08/2014  9:47 AM  PATIENT:  Christine Castro  28 y.o. female  PRE-OPERATIVE DIAGNOSIS:  Previous Cesarean Section, Term  POST-OPERATIVE DIAGNOSIS:  Previous Cesarean Section, Term, extensive anterior abdominal wall omental adhesion  PROCEDURE:  Repeat Cesarean section , Sharl MaKerr hysterotomy, extensive anterior abdominal wall adhesiolysis  SURGEON:  Surgeon(s) and Role:    * Alivia Cimino Cathie BeamsA Taysha Majewski, MD - Primary  PHYSICIAN ASSISTANT:   ASSISTANTS: Raelyn Moraolitta Dawson, CNM   ANESTHESIA:   spinal Findings: live vtx female floating, copious clear amniotic fluid.nl tubes and ovaries, extensive anterior abdominal wall omental adhesion to bladder and wall. Apgar 8/9, anterior placenta EBL:  Total I/O In: 3000 [I.V.:3000] Out: 850 [Urine:200; Blood:650]  BLOOD ADMINISTERED:none  DRAINS: none   LOCAL MEDICATIONS USED:  MARCAINE     SPECIMEN:  No Specimen  DISPOSITION OF SPECIMEN:  N/A  COUNTS:  YES  TOURNIQUET:  * No tourniquets in log *  DICTATION: .Other Dictation: Dictation Number [id number]  PLAN OF CARE: Admit to inpatient   PATIENT DISPOSITION:  PACU - hemodynamically stable.   Delay start of Pharmacological VTE agent (>24hrs) due to surgical blood loss or risk of bleeding: no

## 2014-04-08 NOTE — Anesthesia Procedure Notes (Signed)
Spinal  Patient location during procedure: OR Start time: 04/08/2014 8:39 AM End time: 04/08/2014 8:41 AM Staffing Anesthesiologist: Leilani AbleHATCHETT, Daevon Holdren Performed by: anesthesiologist  Preanesthetic Checklist Completed: patient identified, surgical consent, pre-op evaluation, timeout performed, IV checked, risks and benefits discussed and monitors and equipment checked Spinal Block Patient position: sitting Prep: site prepped and draped and DuraPrep Patient monitoring: heart rate, cardiac monitor, continuous pulse ox and blood pressure Approach: midline Location: L3-4 Injection technique: single-shot Needle Needle type: Pencan  Needle gauge: 24 G Needle length: 9 cm Needle insertion depth: 5 cm Assessment Sensory level: T4

## 2014-04-08 NOTE — Progress Notes (Signed)
DILAUDID 2 MG PO GIVEN FOR C/O INCISION PAIN SCALE 7 AND H/A SCALE 5. DOSE CUT IN HALF DUE TO LOW B/P. PLAN WITH PT TO REPEAT DOSE IN ONE HOUR IF NO PAIN CONTROL NOTED. PT HAS HX OF MIGRAINES

## 2014-04-08 NOTE — Anesthesia Postprocedure Evaluation (Signed)
  Anesthesia Post-op Note  Patient: Christine Castro  Procedure(s) Performed: Procedure(s) with comments: Repeat CESAREAN SECTION; lysis of adhesions (N/A) - EDD: 04/10/14  Patient Location: PACU and Mother/Baby  Anesthesia Type:Spinal  Level of Consciousness: awake, alert  and oriented  Airway and Oxygen Therapy: Patient Spontanous Breathing  Post-op Pain: mild  Post-op Assessment: Post-op Vital signs reviewed  Post-op Vital Signs: Reviewed and stable  Last Vitals:  Filed Vitals:   04/08/14 1725  BP: 114/62  Pulse: 79  Temp:   Resp: 16    Complications: No apparent anesthesia complications

## 2014-04-08 NOTE — Transfer of Care (Signed)
Immediate Anesthesia Transfer of Care Note  Patient: Christine Castro  Procedure(s) Performed: Procedure(s) with comments: Repeat CESAREAN SECTION; lysis of adhesions (N/A) - EDD: 04/10/14  Patient Location: PACU  Anesthesia Type:Spinal  Level of Consciousness: awake  Airway & Oxygen Therapy: Patient Spontanous Breathing  Post-op Assessment: Report given to PACU RN and Post -op Vital signs reviewed and stable  Post vital signs: Reviewed and stable  Complications: No apparent anesthesia complications

## 2014-04-09 ENCOUNTER — Encounter (HOSPITAL_COMMUNITY): Payer: Self-pay | Admitting: Obstetrics and Gynecology

## 2014-04-09 LAB — CBC
HCT: 30.2 % — ABNORMAL LOW (ref 36.0–46.0)
Hemoglobin: 9.8 g/dL — ABNORMAL LOW (ref 12.0–15.0)
MCH: 26.6 pg (ref 26.0–34.0)
MCHC: 32.5 g/dL (ref 30.0–36.0)
MCV: 81.8 fL (ref 78.0–100.0)
Platelets: 174 10*3/uL (ref 150–400)
RBC: 3.69 MIL/uL — ABNORMAL LOW (ref 3.87–5.11)
RDW: 14.5 % (ref 11.5–15.5)
WBC: 11.4 10*3/uL — AB (ref 4.0–10.5)

## 2014-04-09 LAB — CCBB MATERNAL DONOR DRAW

## 2014-04-09 LAB — BIRTH TISSUE RECOVERY COLLECTION (PLACENTA DONATION)

## 2014-04-09 MED ORDER — OXYCODONE-ACETAMINOPHEN 5-325 MG PO TABS
1.0000 | ORAL_TABLET | ORAL | Status: DC | PRN
  Administered 2014-04-09 – 2014-04-10 (×4): 2 via ORAL
  Administered 2014-04-10: 1 via ORAL
  Administered 2014-04-10 (×4): 2 via ORAL
  Administered 2014-04-11: 1 via ORAL
  Filled 2014-04-09 (×3): qty 2
  Filled 2014-04-09: qty 1
  Filled 2014-04-09 (×6): qty 2

## 2014-04-09 MED ORDER — LACTATED RINGERS IV SOLN: Freq: Once | INTRAVENOUS | Status: DC

## 2014-04-09 MED ORDER — BUTALBITAL-APAP-CAFFEINE 50-325-40 MG PO TABS
2.0000 | ORAL_TABLET | Freq: Four times a day (QID) | ORAL | Status: DC | PRN
  Administered 2014-04-09: 2 via ORAL
  Filled 2014-04-09: qty 2

## 2014-04-09 NOTE — Lactation Note (Signed)
This note was copied from the chart of Christine Ryelle Aldaco. Lactation Consultation Note  Patient Name: Christine Poet Durwin NoraDixon Today's Date: 04/09/2014 Reason for consult: Initial assessment   Maternal Data    Feeding Feeding Type: Breast Fed Length of feed: 15 min  LATCH Score/Interventions Latch: Grasps breast easily, tongue down, lips flanged, rhythmical sucking. (with minimum assist)  Audible Swallowing: A few with stimulation Intervention(s): Skin to skin  Type of Nipple: Everted at rest and after stimulation  Comfort (Breast/Nipple): Soft / non-tender     Hold (Positioning): Assistance needed to correctly position infant at breast and maintain latch.  LATCH Score: 8  Lactation Tools Discussed/Used     Consult Status Consult Status: PRN  Lactation visit to assist with positioning and latch. Mother states she had to use a nipple shield with her last baby and fed for 5-6 weeks. This baby is rooting and sucking on his fist. Baby to breast in football hold and mother taught to latch in asymmetrical latch. Baby on breast and feeding well in a rhythmic pattern. Mother reports leaking small amount during the pregnancy. Basic teaching done. Mother reports comfort with the feeding. Mom encouraged to feed baby 8-12 times/24 hours and with feeding cues. Mom encouraged to feed baby 8-12 times/24 hours and with feeding cues.  Mom made aware of O/P services, breastfeeding support groups, community resources, and our phone # for post-discharge questions. Mother to call for assistance as needed.   Christella HartiganDaly, Izaya Netherton M 04/09/2014, 5:01 PM

## 2014-04-09 NOTE — Progress Notes (Signed)
POSTOPERATIVE DAY # 1 S/P Cesarean   S:         Reports feeling tired and in pain states "my last medication was at 0100 in the morning I think I waited too long"             Tolerating po intake / no nausea / no vomiting / positve flatus / no BM             Bleeding is light             Pain 8 out of 10 controlled with percocet             Up ad lib / ambulatory/ voiding QS  Newborn breastfeeding feeding     O:  VS: BP 113/69  Pulse 79  Temp(Src) 98.9 F (37.2 C) (Oral)  Resp 20  Ht 5\' 3"  (1.6 m)  Wt 81.194 kg (179 lb)  BMI 31.72 kg/m2  SpO2 98%  Breastfeeding? Unknown   LABS:               Recent Labs  04/07/14 1100 04/09/14 0540  WBC 9.3 11.4*  HGB 10.5* 9.8*  PLT 159 174               Bloodtype: --/--/O POS (07/07 1100)  Rubella: Immune (12/04 0000)                                             I&O: Intake/Output     07/08 0701 - 07/09 0700 07/09 0701 - 07/10 0700   P.O. 480    I.V. (mL/kg) 3300 (40.6)    Total Intake(mL/kg) 3780 (46.6)    Urine (mL/kg/hr) 4175    Blood 650    Total Output 4825     Net -1045          Urine Occurrence 1 x                 Physical Exam:             Alert and Oriented X3  Lungs: Clear and unlabored  Heart: regular rate and rhythm / no mumurs  Abdomen: soft, non-tender, non-distended positive bowel sounds             Fundus: firm, non-tender, 2 FB below umbilicus             Dressing: honeycomb dressing scant drainage,otherwise intact             Incision:  approximated with staples / no erythema / no ecchymosis / scant serous drainage  Extremities: trace edema to bilateral pedal digits, no calf pain or tenderness  A:        POD # 1 S/P Cesarean            IDA exacerbated by ABL     P:        Routine postoperative care              Educated on pain management, not waiting long periods between medication doses, use of K- pad to  Abdomen  Continue Iron supplementation  Start Magnesium oxide for bowel  peristalsis       Lind CovertMcGroary, Cohen Boettner SNM 04/09/2014, 10:52 AM

## 2014-04-09 NOTE — Op Note (Signed)
NAMJaney Castro:  Middleton, Christine Castro                 ACCOUNT NO.:  [id number]  MEDICAL RECORD NO.:  [id number]  LOCATION:  9131                          FACILITY:  [facility]  PHYSICIAN:  Maxie BetterSheronette Shilah Hefel, M.D.DATE OF BIRTH:  [birth year]  DATE OF PROCEDURE:  04/08/2014 DATE OF DISCHARGE:                              OPERATIVE REPORT   PREOPERATIVE DIAGNOSES:  Previous cesarean section, term gestation.  PROCEDURE:  Repeat cesarean section, Kerr hysterotomy, extensive lysis of adhesion, (abdominal wall adhesions).  POSTOPERATIVE DIAGNOSES:  Previous cesarean section, term gestation, extensive anterior abdominal wall adhesions.  ANESTHESIA:  Spinal.  SURGEON:  Maxie BetterSheronette Koray Soter, MD  ASSISTANT:  Raelyn Moraolitta Dawson, C.N.M.  DESCRIPTION OF PROCEDURE:  Under adequate spinal anesthesia, the patient was placed in the supine position with a left lateral tilt.  She was sterilely prepped and draped in usual fashion.  Indwelling Foley catheter was sterilely placed.  A 0.25% Marcaine was injected along the previous Pfannenstiel skin incision site.  Pfannenstiel skin incision was then made, carried down to the rectus fascia using cautery.  The rectus fascia was opened transversely.  The rectus fascia was then bluntly and sharply dissected off the rectus muscle in superior and inferior fashion and dissected the superior aspect of the rectus fascia off the rectus muscle.  There was an opening separation of the muscles which with evidence of the omentum and the peritoneum.  At that point, that site was utilized to to get into the abdominal cavity, and to allow forward extension of the dissection superiorly.  In doing the opening, it was then noted that I was in the middle of the omental adhesion which was adherent across the entire anterior abdominal wall.  Attempt at finding location of the lateral attachment of the omentum prior to attempting to make incision in the uterus was unsuccessful as there was a wide adherence  to the anterior wall.  Therefore with careful continued dissection into the omentum, the extension superiorly and inferiorly was then carefully done.  The bladder blade was used to displace the omentum inferiorly.  The vesicouterine peritoneum had some adhesions.  The vesicouterine peritoneum was opened transversely and bluntly and sharply dissected off the lower uterine segment with sharp dissection and a bladder retractor adjusted to displace the bladder inferiorly.  Lower uterine segment was thin.  Low uterine incision was made, extended with bandage scissors.  The amniotic sac was intact and was ruptured with clear, copious amniotic fluid noted.  The vertex was floating and therefore once stabilized, the vacuum was then used to stabilize the head and extract the baby.  Baby's head was then bulb suctioned on the abdomen.  Subsequent delivery of a live female was accomplished.  Baby was bulb suctioned on the abdomen.  Cord was clamped, cut.  The baby was transferred to the awaiting pediatricians who assigned Apgars 8 and 9 at 1 and 5 minutes.  The placenta was anterior which was easily removed. Cord blood donation was done.  The uterine incision had no extension and was closed in 2 layers, the first layer with 0 Monocryl running locked stitch, second layer was imbricated using 0 Monocryl suture.  The bleeder on the inferior aspect of  the uterus resulted in a 2-0 Vicryl figure-of-eight suture being placed because hemostasis was not achievable with cautery.  Small bleeding along the peritoneal edges were then cauterized.  Abdomen was then copiously irrigated and suctioned. Normal tubes and ovaries were noted bilaterally.  Attention was then turned to the omentum which could not remain in its location which was the anterior abdominal wall, 20 minutes of the dissection was achieved with the omentum being taken off the entire anterior abdominal wall. The defect in the omentum being removed with  the clamp, cut and free tied with 0 Vicryl sutures.  Once everything was free and mobile, the parietal peritoneum that could now then be seen, and it was closed with 2-0 Vicryl running stitch, followed by rectus fascia closure with 0 Vicryl x2.  The subcutaneous area was irrigated, small bleeders were cauterized.  Interrupted 2-0 plain sutures placed and the skin approximated using Ethicon staples.  SPECIMEN:  Placenta not sent to Pathology.  ESTIMATED BLOOD LOSS:  650 mL.  INTRAOPERATIVE FLUIDS:  3300 mL.  URINE OUTPUT:  200 mL clear yellow urine.  COUNTS:  Sponge and instrument counts x2 was correct.  COMPLICATION:  None.  The patient tolerated the procedure well, was transferred to recovery in stable condition.     Maxie Better, M.D.     /MEDQ  D:  04/08/2014  T:  04/09/2014  Job:  [id number]

## 2014-04-10 LAB — TYPE AND SCREEN
ABO/RH(D): O POS
ANTIBODY SCREEN: NEGATIVE
UNIT DIVISION: 0
Unit division: 0

## 2014-04-10 MED ORDER — MAGNESIUM OXIDE 400 (241.3 MG) MG PO TABS
400.0000 mg | ORAL_TABLET | Freq: Every day | ORAL | Status: DC
  Administered 2014-04-11: 400 mg via ORAL
  Filled 2014-04-10 (×2): qty 1

## 2014-04-10 NOTE — Progress Notes (Signed)
POD # 2  Subjective: Pt reports feeling well/ Pain well controlled with Percocet-went too long between doses Tolerating po/Voiding without problems/ No n/v/ Flatus absent Activity: ad lib Bleeding is light Newborn info:  Information for the patient's newborn:  Gaynelle ArabianDixon, Boy Estee [[id number]][[id number]]  female  / Circumcision: done/ Feeding: breast, having difficulty with latch   Objective: VS:  Filed Vitals:   04/09/14 0500 04/09/14 1800 04/09/14 2044 04/10/14 0608  BP: 113/69 127/70  110/65  Pulse: 79 83  75  Temp: 98.9 F (37.2 C) 99 F (37.2 C) 98.4 F (36.9 C) 97.6 F (36.4 C)  TempSrc: Oral Oral Oral Oral  Resp: 20 18  18   Height:      Weight:      SpO2: 98%       I&O: Intake/Output     07/09 0701 - 07/10 0700 07/10 0701 - 07/11 0700   P.O. 480    I.V. (mL/kg)     Total Intake(mL/kg) 480 (5.9)    Urine (mL/kg/hr) 800 (0.4)    Blood     Total Output 800     Net -320            LABS:  Recent Labs  04/07/14 1100 04/09/14 0540  WBC 9.3 11.4*  HGB 10.5* 9.8*  HCT 32.7* 30.2*  PLT 159 174    Blood type: --/--/O POS (07/07 1100) Rubella: Immune (12/04 0000)     Physical Exam:  General: alert and cooperative CV: Regular rate and rhythm Resp: CTA bilaterally Abdomen: soft, nontender, normal bowel sounds Uterine Fundus: firm, below umbilicus, nontender Incision: Covered with Tegaderm and honeycomb dressing; no significant drainage, edema, bruising, or erythema; well approximated with staples Lochia: minimal Ext: extremities normal, atraumatic, no cyanosis or edema and Homans sign is negative, no sign of DVT    Assessment/: POD # 2/ G3P2002/ S/P C/Section d/t repeat IDA with compounding ABL anemia Lactation difficulty Doing well  Plan: Lactation consult to assess and make recommendations for poor latch Warm liquids-tea, apple or prune juice for GI motility Ambulate in halls Continue routine post op orders Anticipate discharge home  tomorrow   Signed: Donette LarryBHAMBRI, Ramsey Midgett, Dorris CarnesN, MSN, CNM 04/10/2014, 10:26 AM

## 2014-04-10 NOTE — Lactation Note (Signed)
This note was copied from the chart of Christine Castro. Lactation Consultation Note  Baby is sleepy. Having a hard time keeping him active at the breast. With massage mother was able to hand express drops of colostrum from both breasts. Mother's breasts are filling and uncomfortable.  Her nipples are sore and compressed when unlatching in fb position. Repositioned baby to cross cradle position.  Baby tucks in his bottom lip.  Demonstrated to FOB how to check bottom lip with teachback. Mother states improved comfort.  Placed pillows under baby to support him. Rhythmical sucks and swallow observed for 15 min.   Reviewed massaging breasts during feeding, apply ice and post pumping with hand pump 5-7 min for comfort after. Reviewed waking techniques and encouraged STS. Encouraged parents to call if further assistance is needed.    Patient Name: Christine Casandra Durwin NoraDixon Today's Date: 04/10/2014 Reason for consult: Follow-up assessment   Maternal Data    Feeding    LATCH Score/Interventions Latch: Grasps breast easily, tongue down, lips flanged, rhythmical sucking. Intervention(s): Breast massage;Breast compression  Audible Swallowing: A few with stimulation Intervention(s): Alternate breast massage  Type of Nipple: Everted at rest and after stimulation  Comfort (Breast/Nipple): Filling, red/small blisters or bruises, mild/mod discomfort  Problem noted: Filling  Hold (Positioning): No assistance needed to correctly position infant at breast.  LATCH Score: 8  Lactation Tools Discussed/Used Tools: Pump   Consult Status Consult Status: Follow-up Date: 04/11/14 Follow-up type: In-patient    Dahlia ByesBerkelhammer, Natalyia Innes [facility] 04/10/2014, 12:53 PM

## 2014-04-11 DIAGNOSIS — B009 Herpesviral infection, unspecified: Secondary | ICD-10-CM | POA: Diagnosis not present

## 2014-04-11 MED ORDER — VALACYCLOVIR HCL 500 MG PO TABS: 500.0000 mg | ORAL_TABLET | Freq: Two times a day (BID) | ORAL | Status: AC

## 2014-04-11 MED ORDER — SIMETHICONE 80 MG PO CHEW
80.0000 mg | CHEWABLE_TABLET | Freq: Three times a day (TID) | ORAL | Status: DC
  Administered 2014-04-11: 80 mg via ORAL
  Filled 2014-04-11: qty 1

## 2014-04-11 MED ORDER — FERROUS SULFATE 325 (65 FE) MG PO TABS: 325.0000 mg | ORAL_TABLET | Freq: Two times a day (BID) | ORAL | Status: DC

## 2014-04-11 MED ORDER — SIMETHICONE 80 MG PO CHEW
80.0000 mg | CHEWABLE_TABLET | Freq: Every evening | ORAL | Status: DC
  Administered 2014-04-11: 80 mg via ORAL
  Filled 2014-04-11: qty 1

## 2014-04-11 MED ORDER — OXYCODONE-ACETAMINOPHEN 5-325 MG PO TABS: 1.0000 | ORAL_TABLET | ORAL | Status: DC | PRN

## 2014-04-11 NOTE — Progress Notes (Addendum)
POD # 3  Subjective: Pt reports feeling well/ Pain controlled with Percocet Tolerating po/Voiding without problems/ No n/v/ Flatus present C/o lesion under nose, came up overnight, thinks HSV, last episode during pregnancy, denies genital HSV Activity: ad lib Bleeding is light Newborn info:  Information for the patient's newborn:  Gaynelle ArabianDixon, Boy Maisyn [[id number]][[id number]]  female  / Circumcision: done/ Feeding: breast, latch improving   Objective: VS: VS:  Filed Vitals:   04/09/14 2044 04/10/14 0608 04/10/14 1759 04/11/14 0602  BP:  110/65 113/76 119/81  Pulse:  75 74 93  Temp: 98.4 F (36.9 C) 97.6 F (36.4 C) 98.4 F (36.9 C) 98.5 F (36.9 C)  TempSrc: Oral Oral Oral Oral  Resp:  18 18 18   Height:      Weight:      SpO2:        I&O: Intake/Output     07/10 0701 - 07/11 0700 07/11 0701 - 07/12 0700   P.O.     Total Intake(mL/kg)     Urine (mL/kg/hr)     Total Output       Net              LABS:  Recent Labs  04/09/14 0540  WBC 11.4*  HGB 9.8*  PLT 174                           Physical Exam:  General: alert and cooperative CV: Regular rate and rhythm Resp: CTA bilaterally Abdomen: soft, nontender, normal bowel sounds Incision: healing well, no drainage, no erythema, no hernia, no seroma, no swelling, well approximated with staples, honeycomb dsg c/d/i Uterine Fundus: firm, below umbilicus, nontender Lochia: minimal Ext: extremities normal, atraumatic, no cyanosis or edema and Homans sign is negative, no sign of DVT  Integ: 8mm crusted lesion below right nare   Assessment: POD # 3/ G3P2002/ S/P C/Section d/t repeat IDA with compounding ABL anemia Subnasal HSV Doing well and stable for discharge home  Plan: Discharge home RX's: Percocet 5/325 1 - 2 tabs po every 4 hrs prn pain #30 Refill x 0 Valtrex 500mg  po x3 days Niferex 150mg  po BID #60 Refill x 1 [facility] booklet given    Signed: Donette LarryBHAMBRI, Jhana Giarratano, N, MSN, CNM 04/11/2014, 9:30 AM

## 2014-04-11 NOTE — Lactation Note (Signed)
This note was copied from the chart of Christine Zakya Kulick. Lactation Consultation Note Being D/c home today. C/o sore nipples from first day. Noted slight bruising, has good everted nipples. Mom states has wide open latch now, but didn't the first day. Feedings going well. BF last child for 6 weeks, d/t going back to work. Mom done hand expression and noted transitioning of milk. CG given, encouraged to rub milk on nipples and let air dry. Shells given for sore nipples. Reminded of OP services if has any questions or needs. Patient Name: Christine Castro NoraDixon Today's Date: 04/11/2014     Maternal Data    Feeding    LATCH Score/Interventions                      Lactation Tools Discussed/Used     Consult Status      Charyl DancerCARVER, Genae Strine G 04/11/2014, 10:15 AM

## 2014-04-12 NOTE — Discharge Summary (Signed)
POSTOPERATIVE DISCHARGE SUMMARY:  Patient ID: Christine Castro MRN: [id number] DOB/AGE: 29-Jun-1986 28 y.o.  Admit date: 04/08/2014 Admission Diagnoses: 39.[redacted] weeks gestation, IDA   Discharge date: 04/12/2014 Discharge Diagnoses: S/P Repeat C/S on 04/08/2014        Prenatal history: G3P2002   EDC : 04/10/2014, by Other Basis  Has received prenatal care at [facility] since [redacted] wks gestation. Primary provider : Dr. Cherly Hensen Prenatal course complicated by nuchal thickening-normal AFP, Rt bundle branch block, polyhydramnios, IDA  Prenatal labs: ABO, Rh: --/--/O POS (07/07 1100)  Antibody: NEG (07/07 1100) Rubella:   Immune RPR: NON REAC (07/07 1100)  HBsAg: Negative (12/04 0000)  HIV: Non-reactive (12/04 0000)  GBS:   Neg GTT: 98  Medical / Surgical History :  Past medical history:  Past Medical History  Diagnosis Date  . Polycystic ovaries   . Hirsutism   . Abnormal Pap smear   . Other seborrheic keratosis   . History of chicken pox   . Diseases of tricuspid valve   . IBS (irritable bowel syndrome)   . Right bundle branch block     incomplete, assumed congenital,   . PP care - s/p 1C/S 12/02/2011  . Maternal iron deficiency anemia 12/02/2011  . Heartburn in pregnancy   . GERD (gastroesophageal reflux disease)   . Migraine, unspecified, without mention of intractable migraine without mention of status migrainosus     last one 6 months ago   . Sciatica     history with 2013 pregnancy, no current problems  . Postpartum care following cesarean delivery (7/8) 04/08/2014    Past surgical history:  Past Surgical History  Procedure Laterality Date  . Wisdom tooth extraction    . Colonoscopy    . Cesarean section  12/01/2011    Procedure: CESAREAN SECTION;  Surgeon: Serita Kyle, MD;  Location: [facility]S;  Service: Gynecology;  Laterality: N/A;  . Cesarean section N/A 04/08/2014    Procedure: Repeat CESAREAN SECTION; lysis of adhesions;  Surgeon: Serita Kyle, MD;  Location: [facility];  Service: Obstetrics;  Laterality: N/A;  EDD: 04/10/14     Medications on Admission: No prescriptions prior to admission    Allergies: Aspirin; Darvocet; Dimethicone; and Sulfur   Intrapartum Course:  Admited for scheduled repeat CS  Postpartum course: by subnasal HSV infection, IDA with compounding ABL anemia  Physical Exam:   VSS: Blood pressure 119/81, pulse 93, temperature 98.5 F (36.9 C), temperature source Oral, resp. rate 18, height 5\' 3"  (1.6 m), weight 81.194 kg (179 lb), SpO2 98.00%, unknown if currently breastfeeding.  LABS: No results found for this basename: WBC, HGB, PLT,  in the last 72 hours  Newborn Data Live born female  Birth Weight: 7 lb 12.2 oz (3520 g) APGAR: 8, 9  See operative report for further details  Home with mother.  Discharge Instructions:  Wound Care: keep clean and dry / remove honeycomb POD 7 Postpartum Instructions: [facility] discharge booklet - instructions reviewed Medications:    Medication List    STOP taking these medications       acetaminophen 325 MG tablet  Commonly known as:  TYLENOL     diphenhydrAMINE 25 MG tablet  Commonly known as:  BENADRYL     ranitidine 150 MG tablet  Commonly known as:  ZANTAC     TUMS PO      TAKE these medications       CONCEPT DHA 53.5-38-1 MG Caps  Take 1  tablet by mouth daily.     ferrous sulfate 325 (65 FE) MG tablet  Take 1 tablet (325 mg total) by mouth 2 (two) times daily with a meal.     oxyCODONE-acetaminophen 5-325 MG per tablet  Commonly known as:  PERCOCET/ROXICET  Take 1-2 tablets by mouth every 4 (four) hours as needed for severe pain (as needed).     valACYclovir 500 MG tablet  Commonly known as:  VALTREX  Take 1 tablet (500 mg total) by mouth 2 (two) times daily.            Follow-up Information   Follow up with COUSINS,SHERONETTE A, MD. Schedule an appointment as soon as possible for a visit in 6 weeks. (and staple removal  04/15/14)    Specialty:  Obstetrics and Gynecology   Contact information:   [address] [place] [id number] [phone]         Signed: Donette LarryBHAMBRI, Denys Salinger, N MSN, CNM 04/12/2014, 1:30 PM

## 2014-04-14 ENCOUNTER — Other Ambulatory Visit (HOSPITAL_COMMUNITY): Payer: Self-pay | Admitting: Obstetrics and Gynecology

## 2014-04-14 ENCOUNTER — Ambulatory Visit (HOSPITAL_COMMUNITY)
Admission: RE | Admit: 2014-04-14 | Discharge: 2014-04-14 | Disposition: A | Discharge status: Home / Self Care | Payer: Managed Care, Other (non HMO) | Source: Ambulatory Visit | Attending: Obstetrics and Gynecology | Admitting: Obstetrics and Gynecology

## 2014-04-14 DIAGNOSIS — M25562 Pain in left knee: Secondary | ICD-10-CM

## 2014-04-14 DIAGNOSIS — M7989 Other specified soft tissue disorders: Secondary | ICD-10-CM

## 2014-04-14 DIAGNOSIS — M79609 Pain in unspecified limb: Secondary | ICD-10-CM | POA: Insufficient documentation

## 2014-04-14 NOTE — Progress Notes (Signed)
Seen and agree - Tanya Bailey CNM FACNM 

## 2014-04-14 NOTE — Progress Notes (Signed)
VASCULAR LAB PRELIMINARY  PRELIMINARY  PRELIMINARY  PRELIMINARY  Right lower extremity venous duplex completed.    Preliminary report:  Right:  No evidence of DVT, superficial thrombosis, or Baker's cyst.  Nicholai Willette, RVS 04/14/2014, 12:05 PM

## 2014-08-03 ENCOUNTER — Encounter (HOSPITAL_COMMUNITY): Payer: Self-pay | Admitting: Obstetrics and Gynecology

## 2017-01-14 ENCOUNTER — Inpatient Hospital Stay (HOSPITAL_COMMUNITY): Payer: Managed Care, Other (non HMO)

## 2017-01-14 ENCOUNTER — Inpatient Hospital Stay (HOSPITAL_COMMUNITY)
Admission: AD | Admit: 2017-01-14 | Discharge: 2017-01-14 | Disposition: A | Discharge status: Home / Self Care | Payer: Managed Care, Other (non HMO) | Source: Ambulatory Visit | Attending: Obstetrics and Gynecology | Admitting: Obstetrics and Gynecology

## 2017-01-14 ENCOUNTER — Encounter (HOSPITAL_COMMUNITY): Payer: Self-pay | Admitting: *Deleted

## 2017-01-14 DIAGNOSIS — O99012 Anemia complicating pregnancy, second trimester: Secondary | ICD-10-CM | POA: Insufficient documentation

## 2017-01-14 DIAGNOSIS — K589 Irritable bowel syndrome without diarrhea: Secondary | ICD-10-CM | POA: Diagnosis not present

## 2017-01-14 DIAGNOSIS — O30091 Twin pregnancy, unable to determine number of placenta and number of amniotic sacs, first trimester: Secondary | ICD-10-CM | POA: Diagnosis not present

## 2017-01-14 DIAGNOSIS — G43909 Migraine, unspecified, not intractable, without status migrainosus: Secondary | ICD-10-CM | POA: Insufficient documentation

## 2017-01-14 DIAGNOSIS — O209 Hemorrhage in early pregnancy, unspecified: Secondary | ICD-10-CM | POA: Insufficient documentation

## 2017-01-14 DIAGNOSIS — O30041 Twin pregnancy, dichorionic/diamniotic, first trimester: Secondary | ICD-10-CM

## 2017-01-14 DIAGNOSIS — O288 Other abnormal findings on antenatal screening of mother: Secondary | ICD-10-CM | POA: Diagnosis not present

## 2017-01-14 DIAGNOSIS — D509 Iron deficiency anemia, unspecified: Secondary | ICD-10-CM | POA: Diagnosis not present

## 2017-01-14 DIAGNOSIS — O99282 Endocrine, nutritional and metabolic diseases complicating pregnancy, second trimester: Secondary | ICD-10-CM | POA: Insufficient documentation

## 2017-01-14 DIAGNOSIS — O200 Threatened abortion: Secondary | ICD-10-CM | POA: Insufficient documentation

## 2017-01-14 DIAGNOSIS — Z3A01 Less than 8 weeks gestation of pregnancy: Secondary | ICD-10-CM | POA: Insufficient documentation

## 2017-01-14 DIAGNOSIS — K219 Gastro-esophageal reflux disease without esophagitis: Secondary | ICD-10-CM | POA: Insufficient documentation

## 2017-01-14 DIAGNOSIS — O99352 Diseases of the nervous system complicating pregnancy, second trimester: Secondary | ICD-10-CM | POA: Insufficient documentation

## 2017-01-14 DIAGNOSIS — E282 Polycystic ovarian syndrome: Secondary | ICD-10-CM | POA: Insufficient documentation

## 2017-01-14 DIAGNOSIS — O99612 Diseases of the digestive system complicating pregnancy, second trimester: Secondary | ICD-10-CM | POA: Diagnosis not present

## 2017-01-14 DIAGNOSIS — O99412 Diseases of the circulatory system complicating pregnancy, second trimester: Secondary | ICD-10-CM | POA: Insufficient documentation

## 2017-01-14 DIAGNOSIS — R42 Dizziness and giddiness: Secondary | ICD-10-CM | POA: Diagnosis present

## 2017-01-14 DIAGNOSIS — I4510 Unspecified right bundle-branch block: Secondary | ICD-10-CM | POA: Diagnosis not present

## 2017-01-14 DIAGNOSIS — O3680X0 Pregnancy with inconclusive fetal viability, not applicable or unspecified: Secondary | ICD-10-CM

## 2017-01-14 LAB — COMPREHENSIVE METABOLIC PANEL
ALBUMIN: 4.3 g/dL (ref 3.5–5.0)
ALT: 29 U/L (ref 14–54)
ANION GAP: 6 (ref 5–15)
AST: 22 U/L (ref 15–41)
Alkaline Phosphatase: 54 U/L (ref 38–126)
BILIRUBIN TOTAL: 0.6 mg/dL (ref 0.3–1.2)
BUN: 11 mg/dL (ref 6–20)
CO2: 26 mmol/L (ref 22–32)
CREATININE: 0.61 mg/dL (ref 0.44–1.00)
Calcium: 9 mg/dL (ref 8.9–10.3)
Chloride: 103 mmol/L (ref 101–111)
GFR calc Af Amer: >60 mL/min (ref >60)
GFR calc non Af Amer: >60 mL/min (ref >60)
Glucose, Bld: 87 mg/dL (ref 65–99)
POTASSIUM: 4 mmol/L (ref 3.5–5.1)
SODIUM: 135 mmol/L (ref 135–145)
Total Protein: 7.4 g/dL (ref 6.5–8.1)

## 2017-01-14 LAB — CBC
HEMATOCRIT: 39.3 % (ref 36.0–46.0)
Hemoglobin: 13.8 g/dL (ref 12.0–15.0)
MCH: 29.8 pg (ref 26.0–34.0)
MCHC: 35.1 g/dL (ref 30.0–36.0)
MCV: 84.9 fL (ref 78.0–100.0)
Platelets: 256 10*3/uL (ref 150–400)
RBC: 4.63 MIL/uL (ref 3.87–5.11)
RDW: 13.7 % (ref 11.5–15.5)
WBC: 8.2 10*3/uL (ref 4.0–10.5)

## 2017-01-14 LAB — URINALYSIS, ROUTINE W REFLEX MICROSCOPIC
BILIRUBIN URINE: NEGATIVE
Glucose, UA: NEGATIVE mg/dL
Ketones, ur: NEGATIVE mg/dL
Leukocytes, UA: NEGATIVE
Nitrite: NEGATIVE
PROTEIN: NEGATIVE mg/dL
SPECIFIC GRAVITY, URINE: 1.006 (ref 1.005–1.030)
pH: 5 (ref 5.0–8.0)

## 2017-01-14 LAB — POCT PREGNANCY, URINE: PREG TEST UR: POSITIVE — AB

## 2017-01-14 NOTE — MAU Note (Signed)
Pt reports she is [redacted] weeks pregnant and about 11:30 she began cramping, bleeding and felt dizzy like she was going to pass out. States this same thing happened about one week ago while she was out of town and u/s confirmed IUP.

## 2017-01-14 NOTE — MAU Provider Note (Signed)
History     Cc: vaginal bleeding, dizziness HPI: 31 yo G3P2002 MWF ~[redacted] weeks pregnant by sono done in [place] presents with c/o passing clots and vaginal bleeding. Pt also c/o dizziness as she did  In [place] which had led her to be evaluated there. She is scheduled for repeat sonogram tomorrow. Prior sono had shown a gestational sac. Pt notes some abdominal cramping. She ate this am  OB History    Gravida Para Term Preterm AB Living   0 0 2   SAB TAB Ectopic Multiple Live Births   0 0 0 0 2      Past Medical History:  Diagnosis Date  . Abnormal Pap smear   . Diseases of tricuspid valve   . GERD (gastroesophageal reflux disease)   . Heartburn in pregnancy   . Hirsutism   . History of chicken pox   . IBS (irritable bowel syndrome)   . Maternal iron deficiency anemia 12/02/2011  . Migraine, unspecified, without mention of intractable migraine without mention of status migrainosus    last one 6 months ago   . Other seborrheic keratosis   . Polycystic ovaries   . Postpartum care following cesarean delivery (7/8) 04/08/2014  . PP care - s/p 1C/S 12/02/2011  . Right bundle branch block    incomplete, assumed congenital,   . Sciatica    history with 2013 pregnancy, no current problems    Past Surgical History:  Procedure Laterality Date  . CESAREAN SECTION  12/01/2011   Procedure: CESAREAN SECTION;  Surgeon: Serita Kyle, MD;  Location: [facility];  Service: Gynecology;  Laterality: N/A;  . CESAREAN SECTION N/A 04/08/2014   Procedure: Repeat CESAREAN SECTION; lysis of adhesions;  Surgeon: Serita Kyle, MD;  Location: [facility];  Service: Obstetrics;  Laterality: N/A;  EDD: 04/10/14  . COLONOSCOPY    . WISDOM TOOTH EXTRACTION      Family History  Problem Relation Age of Onset  . Hypertension Maternal Grandfather   . Stroke Maternal Grandfather   . Cancer Maternal Grandfather     bone, prostate, melanoma  . Peripheral vascular disease Mother   . Migraines Mother   .  Depression Mother   . Crohn's disease Mother   . Eczema Mother   . Rheum arthritis Father     juvenile  . Peripheral vascular disease Maternal Grandmother   . Depression Maternal Grandmother   . Hypertension Maternal Grandmother   . Diverticulitis Maternal Grandmother   . Peripheral vascular disease Paternal Grandmother   . Hypertension Paternal Grandmother   . Stroke Paternal Grandmother   . COPD Paternal Grandmother   . Heart disease Paternal Grandmother   . Muscular dystrophy Cousin     Social History  Substance Use Topics  . Smoking status: Never Smoker  . Smokeless tobacco: Never Used  . Alcohol use No    Allergies:  Allergies  Allergen Reactions  . Aspirin Swelling    face  . Darvocet [Propoxyphene N-Acetaminophen] Rash  . Dimethicone Rash    NITRIL GLOVES  . Sulfur Rash    Prescriptions Prior to Admission  Medication Sig Dispense Refill Last Dose  . ferrous sulfate 325 (65 FE) MG tablet Take 1 tablet (325 mg total) by mouth 2 (two) times daily with a meal. 60 tablet 1   . oxyCODONE-acetaminophen (PERCOCET/ROXICET) 5-325 MG per tablet Take 1-2 tablets by mouth every 4 (four) hours as needed for severe pain (as needed). 30 tablet 0   .  Prenat-FeFum-FePo-FA-Omega 3 (CONCEPT DHA) 53.5-38-1 MG CAPS Take 1 tablet by mouth daily.    04/07/2014 at 2300     Physical Exam   Blood pressure 135/81, pulse 71, temperature 98.6 F (37 C), temperature source Oral, resp. rate 18, height  (1.626 m), weight 74.4 kg (164 lb), last menstrual period 11/20/2016, SpO2 99 %, unknown if currently breastfeeding.  General appearance: alert, cooperative and no distress Lungs: clear to auscultation bilaterally Abdomen: soft, non-tender; bowel sounds normal; no masses,  no organomegaly Pelvic: external genitalia normal and no adnexal masses or tenderness Extremities: no edema, redness or tenderness in the calves or thighs  VE dark brown blood in vault. Cervix closed/firm. Uterus 7  weeks AV globular  IMP: vaginal bleeding in  Pregnancy Dizziness P) TVS, CBC CMP ED Course   MDM   Shaylinn Hladik A, MD 2:00 PM 01/14/2017  Addendum:  US Ob Comp Less 14 Wks  Result Date: 01/14/2017 WEEKS PREGNANT WITH BLEEDING.: WEEKS PREGNANT WITH BLEEDING. EXAM: TWIN OBSTETRIC <14WK [place] AND TRANSVAGINAL OB US COMPARISON:  08/02/2011 FINDINGS: Number of IUPs:  2 Chorionicity/Amnionicity:  Dichorionic, diamniotic. TWIN 1 Yolk sac:  Present Embryo:  Present Cardiac Activity: Present Heart Rate: 142 bpm MSD: 22  mm   7 w   0  d CRL:  8  mm   6 w 5 d                  [place] EDC: 09/04/2017 TWIN 2 Irregular gestational sac. Yolk sac:  Absent Embryo:  Absent Cardiac Activity: Absent MSD: 15  mm   6 w   2  d Subchorionic hemorrhage:  None visualized. Maternal uterus/adnexae: Within normal limits. No significant free fluid. IMPRESSION: 1. Twin pregnancy, with the twin 1 demonstrating crown-rump length of 8 mm and fetal heart rate of 142 beats per minute. 2. Twin 2 demonstrating an irregular gestational sac, with absence of yolk sac, fetal pole, or cardiac activity. This is suspicious for an abnormal twin 2 pregnancy. Electronically Signed   By: Jeronimo Greaves M.D.   On: 01/14/2017 15:35   US Ob Comp Addl Gest Less 14 Wks  Result Date: 01/14/2017 WEEKS PREGNANT WITH BLEEDING.: WEEKS PREGNANT WITH BLEEDING. EXAM: TWIN OBSTETRIC <14WK [place] AND TRANSVAGINAL OB US COMPARISON:  08/02/2011 FINDINGS: Number of IUPs:  2 Chorionicity/Amnionicity:  Dichorionic, diamniotic. TWIN 1 Yolk sac:  Present Embryo:  Present Cardiac Activity: Present Heart Rate: 142 bpm MSD: 22  mm   7 w   0  d CRL:  8  mm   6 w 5 d                  [place] EDC: 09/04/2017 TWIN 2 Irregular gestational sac. Yolk sac:  Absent Embryo:  Absent Cardiac Activity: Absent MSD: 15  mm   6 w   2  d Subchorionic hemorrhage:  None visualized. Maternal uterus/adnexae: Within normal limits. No significant free fluid. IMPRESSION: 1. Twin pregnancy, with the twin 1  demonstrating crown-rump length of 8 mm and fetal heart rate of 142 beats per minute. 2. Twin 2 demonstrating an irregular gestational sac, with absence of yolk sac, fetal pole, or cardiac activity. This is suspicious for an abnormal twin 2 pregnancy. Electronically Signed   By: Jeronimo Greaves M.D.   On: 01/14/2017 15:35   US Ob Transvaginal  Result Date: 01/14/2017 WEEKS PREGNANT WITH BLEEDING.: WEEKS PREGNANT WITH BLEEDING. EXAM: TWIN OBSTETRIC <14WK [place] AND TRANSVAGINAL OB US COMPARISON:  08/02/2011 FINDINGS: Number  of IUPs:  2 Chorionicity/Amnionicity:  Dichorionic, diamniotic. TWIN 1 Yolk sac:  Present Embryo:  Present Cardiac Activity: Present Heart Rate: 142 bpm MSD: 22  mm   7 w   0  d CRL:  8  mm   6 w 5 d                  [place] EDC: 09/04/2017 TWIN 2 Irregular gestational sac. Yolk sac:  Absent Embryo:  Absent Cardiac Activity: Absent MSD: 15  mm   6 w   2  d Subchorionic hemorrhage:  None visualized. Maternal uterus/adnexae: Within normal limits. No significant free fluid. IMPRESSION: 1. Twin pregnancy, with the twin 1 demonstrating crown-rump length of 8 mm and fetal heart rate of 142 beats per minute. 2. Twin 2 demonstrating an irregular gestational sac, with absence of yolk sac, fetal pole, or cardiac activity. This is suspicious for an abnormal twin 2 pregnancy. Electronically Signed   By: Jeronimo Greaves M.D.   On: 01/14/2017 15:35   CBC    Component Value Date/Time   WBC 8.2 01/14/2017 1400   RBC 4.63 01/14/2017 1400   HGB 13.8 01/14/2017 1400   HCT 39.3 01/14/2017 1400   PLT 256 01/14/2017 1400   MCV 84.9 01/14/2017 1400   MCH 29.8 01/14/2017 1400   MCHC 35.1 01/14/2017 1400   RDW 13.7 01/14/2017 1400   CMP Latest Ref Rng & Units 01/14/2017 12/01/2011 11/30/2011  Glucose 65 - 99 mg/dL 87 73 81  BUN 6 - 20 mg/dL Creatinine 0.44 - 1.00 mg/dL 1.61 0.96 0.45  Sodium 135 - 145 mmol/L 135 133(L) 134(L)  Potassium 3.5 - 5.1 mmol/L 4.0 3.8 4.0  Chloride 101 - 111 mmol/L 103 100 100   CO2 22 - 32 mmol/L Calcium 8.9 - 10.3 mg/dL 9.0 8.9 9.2  Total Protein 6.5 - 8.1 g/dL 7.4 4.0(J) 8.1(X)  Total Bilirubin 0.3 - 1.2 mg/dL 0.6 0.4 0.3  Alkaline Phos 38 - 126 U/L 54 147(H) 157(H)  AST 15 - 41 U/L ALT 14 - 54 U/L IMP: threatened abortion Twin with nonviable 2nd sac P) threatened ab precautions. Repeat sono 2 weeks ( office). cxl sched office appt in am. Pelvic rest

## 2017-01-14 NOTE — Discharge Instructions (Signed)
Threatened abortion precautions Nothing per vagina until bleeding stops, call if soaking a pad every hour or more frequently, severe cramping,. Passing tissue

## 2017-01-17 LAB — OB RESULTS CONSOLE RUBELLA ANTIBODY, IGM: Rubella: IMMUNE

## 2017-01-17 LAB — OB RESULTS CONSOLE ABO/RH: RH Type: POSITIVE

## 2017-01-17 LAB — OB RESULTS CONSOLE ANTIBODY SCREEN: ANTIBODY SCREEN: NEGATIVE

## 2017-01-17 LAB — OB RESULTS CONSOLE RPR: RPR: NONREACTIVE

## 2017-01-17 LAB — OB RESULTS CONSOLE GC/CHLAMYDIA
CHLAMYDIA, DNA PROBE: NEGATIVE
GC PROBE AMP, GENITAL: NEGATIVE

## 2017-01-17 LAB — OB RESULTS CONSOLE HEPATITIS B SURFACE ANTIGEN: Hepatitis B Surface Ag: NEGATIVE

## 2017-01-17 LAB — OB RESULTS CONSOLE HIV ANTIBODY (ROUTINE TESTING): HIV: NONREACTIVE

## 2017-03-26 ENCOUNTER — Inpatient Hospital Stay (HOSPITAL_COMMUNITY): Payer: Managed Care, Other (non HMO)

## 2017-03-26 ENCOUNTER — Inpatient Hospital Stay (HOSPITAL_COMMUNITY)
Admission: AD | Admit: 2017-03-26 | Discharge: 2017-03-26 | Disposition: A | Discharge status: Home / Self Care | Payer: Managed Care, Other (non HMO) | Source: Ambulatory Visit | Attending: Obstetrics and Gynecology | Admitting: Obstetrics and Gynecology

## 2017-03-26 ENCOUNTER — Encounter (HOSPITAL_COMMUNITY): Payer: Self-pay | Admitting: *Deleted

## 2017-03-26 DIAGNOSIS — Z87891 Personal history of nicotine dependence: Secondary | ICD-10-CM | POA: Insufficient documentation

## 2017-03-26 DIAGNOSIS — O99412 Diseases of the circulatory system complicating pregnancy, second trimester: Secondary | ICD-10-CM | POA: Insufficient documentation

## 2017-03-26 DIAGNOSIS — O42912 Preterm premature rupture of membranes, unspecified as to length of time between rupture and onset of labor, second trimester: Secondary | ICD-10-CM | POA: Diagnosis present

## 2017-03-26 DIAGNOSIS — O99612 Diseases of the digestive system complicating pregnancy, second trimester: Secondary | ICD-10-CM | POA: Insufficient documentation

## 2017-03-26 DIAGNOSIS — Z3A18 18 weeks gestation of pregnancy: Secondary | ICD-10-CM | POA: Diagnosis not present

## 2017-03-26 DIAGNOSIS — I4510 Unspecified right bundle-branch block: Secondary | ICD-10-CM | POA: Insufficient documentation

## 2017-03-26 DIAGNOSIS — K219 Gastro-esophageal reflux disease without esophagitis: Secondary | ICD-10-CM | POA: Diagnosis not present

## 2017-03-26 DIAGNOSIS — O4290 Premature rupture of membranes, unspecified as to length of time between rupture and onset of labor, unspecified weeks of gestation: Secondary | ICD-10-CM

## 2017-03-26 LAB — URINALYSIS, ROUTINE W REFLEX MICROSCOPIC
Bilirubin Urine: NEGATIVE
Glucose, UA: NEGATIVE mg/dL
HGB URINE DIPSTICK: NEGATIVE
Ketones, ur: NEGATIVE mg/dL
Leukocytes, UA: NEGATIVE
Nitrite: NEGATIVE
PH: 5 (ref 5.0–8.0)
Protein, ur: NEGATIVE mg/dL
SPECIFIC GRAVITY, URINE: 1.004 — AB (ref 1.005–1.030)

## 2017-03-26 LAB — AMNISURE RUPTURE OF MEMBRANE (ROM) NOT AT ARMC: AMNISURE: NEGATIVE

## 2017-03-26 LAB — POCT FERN TEST: POCT Fern Test: NEGATIVE

## 2017-03-26 NOTE — MAU Note (Signed)
Pt presents to MAU with complaints of leakage of fluid earlier today around 1330. Denies any VB. Pt continues to have leakage. Lower abdominal cramping

## 2017-03-26 NOTE — MAU Provider Note (Signed)
History     Chief Complaint  Patient presents with  . possible rupture of membranes   31 yo G3P2002 MWF @ [redacted] weeks gestation presents with c/o intermittent leakage of fluid since 1:30 pm. Denies  Any  Abdominal pain or vaginal bleeding  OB History    Gravida Para Term Preterm AB Living   3 2 2  0 0 2   SAB TAB Ectopic Multiple Live Births   0 0 0 0 2      Past Medical History:  Diagnosis Date  . Abnormal Pap smear   . Diseases of tricuspid valve   . GERD (gastroesophageal reflux disease)   . Heartburn in pregnancy   . Hirsutism   . History of chicken pox   . IBS (irritable bowel syndrome)   . Maternal iron deficiency anemia 12/02/2011  . Migraine, unspecified, without mention of intractable migraine without mention of status migrainosus    last one 6 months ago   . Other seborrheic keratosis   . Polycystic ovaries   . Postpartum care following cesarean delivery (7/8) 04/08/2014  . PP care - s/p 1C/S 12/02/2011  . Right bundle branch block    incomplete, assumed congenital,   . Sciatica    history with 2013 pregnancy, no current problems    Past Surgical History:  Procedure Laterality Date  . CESAREAN SECTION  12/01/2011   Procedure: CESAREAN SECTION;  Surgeon: Serita Kyle, MD;  Location: [facility];  Service: Gynecology;  Laterality: N/A;  . CESAREAN SECTION N/A 04/08/2014   Procedure: Repeat CESAREAN SECTION; lysis of adhesions;  Surgeon: Serita Kyle, MD;  Location: [facility];  Service: Obstetrics;  Laterality: N/A;  EDD: 04/10/14  . COLONOSCOPY    . WISDOM TOOTH EXTRACTION      Family History  Problem Relation Age of Onset  . Hypertension Maternal Grandfather   . Stroke Maternal Grandfather   . Cancer Maternal Grandfather        bone, prostate, melanoma  . Peripheral vascular disease Mother   . Migraines Mother   . Depression Mother   . Crohn's disease Mother   . Eczema Mother   . Rheum arthritis Father        juvenile  . Peripheral vascular disease  Maternal Grandmother   . Depression Maternal Grandmother   . Hypertension Maternal Grandmother   . Diverticulitis Maternal Grandmother   . Peripheral vascular disease Paternal Grandmother   . Hypertension Paternal Grandmother   . Stroke Paternal Grandmother   . COPD Paternal Grandmother   . Heart disease Paternal Grandmother   . Muscular dystrophy Cousin     Social History  Substance Use Topics  . Smoking status: Former Games developer  . Smokeless tobacco: Never Used  . Alcohol use No    Allergies:  Allergies  Allergen Reactions  . Aspirin Swelling    face  . Darvocet [Propoxyphene N-Acetaminophen] Rash  . Dimethicone Rash    NITRIL GLOVES  . Sulfur Rash    Prescriptions Prior to Admission  Medication Sig Dispense Refill Last Dose  . Prenat-FeFum-FePo-FA-Omega 3 (CONCEPT DHA) 53.5-38-1 MG CAPS Take 1 tablet by mouth daily.    04/07/2014 at 2300     Physical Exam   Blood pressure 120/78, pulse (!) 102, temperature 98.3 F (36.8 C), resp. rate 18, weight 75.3 kg (166 lb), last menstrual period 11/20/2016, unknown if currently breastfeeding.  General appearance: alert, cooperative and no distress Lungs: clear to auscultation bilaterally Heart: regular rate and rhythm, S1, S2  normal, no murmur, click, rub or gallop Abdomen: soft gravid nontender (+) FHR Pelvic: cervix normal in appearance, external genitalia normal and cervix closed visually long Extremities: no edema, redness or tenderness in the calves or thighs  Fern done: neg. amniosure done ED Course  IMP: Leaking fluid IUP @ 18 weeks P) check amniosure. OB sono MDM  amniosure neg Sono: subjectively nl fluid Therefore no ROM  Pt d/c home Keep scheduled OB appt  Ricka Westra A, MD 6:13 PM 03/26/2017

## 2017-06-24 ENCOUNTER — Encounter (HOSPITAL_COMMUNITY): Payer: Self-pay

## 2017-07-23 ENCOUNTER — Other Ambulatory Visit: Payer: Self-pay | Admitting: Obstetrics and Gynecology

## 2017-07-24 LAB — OB RESULTS CONSOLE GBS: STREP GROUP B AG: POSITIVE

## 2017-08-06 ENCOUNTER — Encounter: Payer: Self-pay | Admitting: Cardiology

## 2017-08-06 ENCOUNTER — Other Ambulatory Visit: Payer: Self-pay | Admitting: Cardiology

## 2017-08-06 ENCOUNTER — Ambulatory Visit: Payer: Managed Care, Other (non HMO) | Admitting: Cardiology

## 2017-08-06 VITALS — BP 118/82 | HR 108 | Ht 64.0 in | Wt 180.0 lb

## 2017-08-06 DIAGNOSIS — R002 Palpitations: Secondary | ICD-10-CM | POA: Diagnosis not present

## 2017-08-06 NOTE — Progress Notes (Signed)
Cardiology Office Note:    Date:  08/06/2017   ID:  Christine Castro, Christine Castro 07/31/1986, MRN [id number]  PCP:  Wilmer Floor., MD  Cardiologist:  Garwin Brothers, MD   Referring MD: Wilmer Floor., MD    ASSESSMENT:    1. Palpitations    PLAN:    In order of problems listed above:  1. I reassured the patient about my findings.  She has not had blood work for a long time and we will do blood work including TSH. 2. He will undergo 48-hour Holter monitoring for palpitations. 3. Echocardiogram will be done to assess murmur heard on auscultation. 4. If she has any significant issues I will start her on a beta-blocker.  She will be seen in follow-up appointment in 6 months or earlier if she has any concerns.   Medication Adjustments/Labs and Tests Ordered: Current medicines are reviewed at length with the patient today.  Concerns regarding medicines are outlined above.  Orders Placed This Encounter  Procedures  . Basic Metabolic Panel (BMET)  . CBC with Differential/Platelet  . TSH  . Holter monitor - 48 hour  . EKG 12-Lead  . ECHOCARDIOGRAM COMPLETE   No orders of the defined types were placed in this encounter.    History of Present Illness:    Christine Castro is a 31 y.o. female who is being seen today for the evaluation of palpitations at the request of Wilmer Floor., MD.  Patient is a pleasant 31 year old female.  I have evaluated her in the past.  She is in her advanced stage of pregnancy.  She complains of palpitations on and off pretty much on a regular basis.  No chest pain orthopnea or PND.  At the time of my evaluation she is alert awake oriented and in no distress.  He denies any syncope or any such problems.  These palpitations not predictable  Past Medical History:  Diagnosis Date  . Abnormal Pap smear   . Diseases of tricuspid valve   . GERD (gastroesophageal reflux disease)   . Heartburn in pregnancy   . Hirsutism   . History of chicken pox     . IBS (irritable bowel syndrome)   . Maternal iron deficiency anemia 12/02/2011  . Migraine, unspecified, without mention of intractable migraine without mention of status migrainosus    last one 6 months ago   . Other seborrheic keratosis   . Polycystic ovaries   . Postpartum care following cesarean delivery (7/8) 04/08/2014  . PP care - s/p 1C/S 12/02/2011  . Right bundle branch block    incomplete, assumed congenital,   . Sciatica    history with 2013 pregnancy, no current problems    Past Surgical History:  Procedure Laterality Date  . COLONOSCOPY    . WISDOM TOOTH EXTRACTION      Current Medications: Current Meds  Medication Sig  . acyclovir (ZOVIRAX) 400 MG tablet Take 400 mg daily by mouth.  . Magnesium 500 MG CAPS Take 1 capsule daily by mouth.  . Prenat-FeFum-FePo-FA-Omega 3 (CONCEPT DHA) 53.5-38-1 MG CAPS Take 1 tablet by mouth daily.      Allergies:   Tape; Aspirin; Darvocet [propoxyphene n-acetaminophen]; Dimethicone; and Sulfur   Social History   Socioeconomic History  . Marital status: Married    Spouse name: None  . Number of children: None  . Years of education: None  . Highest education level: None  Social Needs  . Financial resource strain: None  .  Food insecurity - worry: None  . Food insecurity - inability: None  . Transportation needs - medical: None  . Transportation needs - non-medical: None  Occupational History  . None  Tobacco Use  . Smoking status: Former Games developermoker  . Smokeless tobacco: Never Used  Substance and Sexual Activity  . Alcohol use: No  . Drug use: No  . Sexual activity: Yes    Birth control/protection: None  Other Topics Concern  . None  Social History Narrative  . None     Family History: The patient's family history includes COPD in her paternal grandmother; Cancer in her maternal grandfather; Crohn's disease in her mother; Depression in her maternal grandmother and mother; Diverticulitis in her maternal grandmother;  Eczema in her mother; Heart disease in her paternal grandmother; Hypertension in her maternal grandfather, maternal grandmother, and paternal grandmother; Migraines in her mother; Muscular dystrophy in her cousin; Peripheral vascular disease in her maternal grandmother, mother, and paternal grandmother; Rheum arthritis in her father; Stroke in her maternal grandfather and paternal grandmother.  ROS:   Please see the history of present illness.    All other systems reviewed and are negative.  EKGs/Labs/Other Studies Reviewed:    The following studies were reviewed today: I reviewed her EKG and reveals sinus tachycardia and nonspecific ST changes.   Recent Labs: 01/14/2017: ALT 29; BUN 11; Creatinine, Ser 0.61; Hemoglobin 13.8; Platelets 256; Potassium 4.0; Sodium 135  Recent Lipid Panel No results found for: CHOL, TRIG, HDL, CHOLHDL, VLDL, LDLCALC, LDLDIRECT  Physical Exam:    VS:  BP 118/82   Pulse (!) 108   Ht 5\' 4"  (1.626 m)   Wt 180 lb (81.6 kg)   LMP 11/20/2016   SpO2 99%   BMI 30.90 kg/m     Wt Readings from Last 3 Encounters:  08/06/17 180 lb (81.6 kg)  03/26/17 166 lb (75.3 kg)  01/14/17 164 lb (74.4 kg)     GEN: Patient is in no acute distress HEENT: Normal NECK: No JVD; No carotid bruits LYMPHATICS: No lymphadenopathy CARDIAC: S1 S2 regular, 2/6 systolic murmur at the apex. RESPIRATORY:  Clear to auscultation without rales, wheezing or rhonchi  ABDOMEN: Soft, non-tender, non-distended MUSCULOSKELETAL:  No edema; No deformity  SKIN: Warm and dry NEUROLOGIC:  Alert and oriented x 3 PSYCHIATRIC:  Normal affect    Signed, Garwin Brothersajan R Revankar, MD  08/06/2017 2:31 PM    [facility]

## 2017-08-06 NOTE — Patient Instructions (Signed)
Medication Instructions:  Your physician recommends that you continue on your current medications as directed. Please refer to the Current Medication list given to you today.   Labwork: Your physician recommends that you have CBC, BMP and TSH   Testing/Procedures: Your physician has requested that you have an echocardiogram. Echocardiography is a painless test that uses sound waves to create images of your heart. It provides your doctor with information about the size and shape of your heart and how well your heart's chambers and valves are working. This procedure takes approximately one hour. There are no restrictions for this procedure.  Your physician has recommended that you wear a holter monitor. Holter monitors are medical devices that record the heart's electrical activity. Doctors most often use these monitors to diagnose arrhythmias. Arrhythmias are problems with the speed or rhythm of the heartbeat. The monitor is a small, portable device. You can wear one while you do your normal daily activities. This is usually used to diagnose what is causing palpitations/syncope (passing out).    Follow-Up: Your physician recommends that you schedule a follow-up appointment in: 6 months   Any Other Special Instructions Will Be Listed Below (If Applicable).     If you need a refill on your cardiac medications before your next appointment, please call your pharmacy.   [facility]  Garey HamAshley A, RN, BSN   Echocardiogram An echocardiogram, or echocardiography, uses sound waves (ultrasound) to produce an image of your heart. The echocardiogram is simple, painless, obtained within a short period of time, and offers valuable information to your health care provider. The images from an echocardiogram can provide information such as:  Evidence of coronary artery disease (CAD).  Heart size.  Heart muscle function.  Heart valve function.  Aneurysm detection.  Evidence of a past heart  attack.  Fluid buildup around the heart.  Heart muscle thickening.  Assess heart valve function.  Tell a health care provider about:  Any allergies you have.  All medicines you are taking, including vitamins, herbs, eye drops, creams, and over-the-counter medicines.  Any problems you or family members have had with anesthetic medicines.  Any blood disorders you have.  Any surgeries you have had.  Any medical conditions you have.  Whether you are pregnant or may be pregnant. What happens before the procedure? No special preparation is needed. Eat and drink normally. What happens during the procedure?  In order to produce an image of your heart, gel will be applied to your chest and a wand-like tool (transducer) will be moved over your chest. The gel will help transmit the sound waves from the transducer. The sound waves will harmlessly bounce off your heart to allow the heart images to be captured in real-time motion. These images will then be recorded.  You may need an IV to receive a medicine that improves the quality of the pictures. What happens after the procedure? You may return to your normal schedule including diet, activities, and medicines, unless your health care provider tells you otherwise. This information is not intended to replace advice given to you by your health care provider. Make sure you discuss any questions you have with your health care provider. Document Released: 09/15/2000 Document Revised: 05/06/2016 Document Reviewed: 05/26/2013 Elsevier Interactive Patient Education  2017 Elsevier Inc.  Holter Monitoring A Holter monitor is a small device that is used to detect abnormal heart rhythms. It clips to your clothing and is connected by wires to flat, sticky disks (electrodes) that attach  to your chest. It is worn continuously for 24-48 hours. Follow these instructions at home:  Wear your Holter monitor at all times, even while exercising and sleeping,  for as long as directed by your health care provider.  Make sure that the Holter monitor is safely clipped to your clothing or close to your body as recommended by your health care provider.  Do not get the monitor or wires wet.  Do not put body lotion or moisturizer on your chest.  Keep your skin clean.  Keep a diary of your daily activities, such as walking and doing chores. If you feel that your heartbeat is abnormal or that your heart is fluttering or skipping a beat: ? Record what you are doing when it happens. ? Record what time of day the symptoms occur.  Return your Holter monitor as directed by your health care provider.  Keep all follow-up visits as directed by your health care provider. This is important. Get help right away if:  You feel lightheaded or you faint.  You have trouble breathing.  You feel pain in your chest, upper arm, or jaw.  You feel sick to your stomach and your skin is pale, cool, or damp.  You heartbeat feels unusual or abnormal. This information is not intended to replace advice given to you by your health care provider. Make sure you discuss any questions you have with your health care provider. Document Released: 06/16/2004 Document Revised: 02/24/2016 Document Reviewed: 04/27/2014 Elsevier Interactive Patient Education  [employer].

## 2017-08-07 LAB — CBC WITH DIFFERENTIAL/PLATELET
Basophils Absolute: 0 10*3/uL (ref 0.0–0.2)
Basos: 0 %
EOS (ABSOLUTE): 0.2 10*3/uL (ref 0.0–0.4)
EOS: 2 %
HEMATOCRIT: 32.7 % — AB (ref 34.0–46.6)
HEMOGLOBIN: 10.9 g/dL — AB (ref 11.1–15.9)
IMMATURE GRANS (ABS): 0.1 10*3/uL (ref 0.0–0.1)
IMMATURE GRANULOCYTES: 1 %
LYMPHS ABS: 1.4 10*3/uL (ref 0.7–3.1)
LYMPHS: 16 %
MCH: 27.2 pg (ref 26.6–33.0)
MCHC: 33.3 g/dL (ref 31.5–35.7)
MCV: 82 fL (ref 79–97)
MONOCYTES: 8 %
Monocytes Absolute: 0.7 10*3/uL (ref 0.1–0.9)
NEUTROS PCT: 73 %
Neutrophils Absolute: 6.6 10*3/uL (ref 1.4–7.0)
Platelets: 187 10*3/uL (ref 150–379)
RBC: 4.01 x10E6/uL (ref 3.77–5.28)
RDW: 14 % (ref 12.3–15.4)
WBC: 9 10*3/uL (ref 3.4–10.8)

## 2017-08-07 LAB — BASIC METABOLIC PANEL
BUN/Creatinine Ratio: 12 (ref 9–23)
BUN: 6 mg/dL (ref 6–20)
CALCIUM: 8.6 mg/dL — AB (ref 8.7–10.2)
CO2: 23 mmol/L (ref 20–29)
CREATININE: 0.52 mg/dL — AB (ref 0.57–1.00)
Chloride: 103 mmol/L (ref 96–106)
GFR calc Af Amer: 147 mL/min/{1.73_m2} (ref >59)
GFR, EST NON AFRICAN AMERICAN: 128 mL/min/{1.73_m2} (ref >59)
GLUCOSE: 113 mg/dL — AB (ref 65–99)
Potassium: 3.7 mmol/L (ref 3.5–5.2)
SODIUM: 138 mmol/L (ref 134–144)

## 2017-08-07 LAB — TSH: TSH: 1.26 u[IU]/mL (ref 0.450–4.500)

## 2017-08-09 ENCOUNTER — Encounter (HOSPITAL_COMMUNITY): Payer: Self-pay

## 2017-08-09 ENCOUNTER — Telehealth (HOSPITAL_COMMUNITY): Payer: Self-pay | Admitting: *Deleted

## 2017-08-09 NOTE — Telephone Encounter (Signed)
Preadmission screen  

## 2017-08-10 ENCOUNTER — Telehealth (HOSPITAL_COMMUNITY): Payer: Self-pay | Admitting: *Deleted

## 2017-08-10 NOTE — Telephone Encounter (Signed)
Preadmission screen  

## 2017-08-13 ENCOUNTER — Encounter (HOSPITAL_COMMUNITY): Payer: Self-pay

## 2017-08-15 ENCOUNTER — Ambulatory Visit (HOSPITAL_COMMUNITY)
Admission: RE | Admit: 2017-08-15 | Discharge: 2017-08-15 | Disposition: A | Discharge status: Home / Self Care | Payer: Managed Care, Other (non HMO) | Source: Ambulatory Visit | Attending: Cardiology | Admitting: Cardiology

## 2017-08-15 ENCOUNTER — Ambulatory Visit: Payer: Managed Care, Other (non HMO)

## 2017-08-15 DIAGNOSIS — R0600 Dyspnea, unspecified: Secondary | ICD-10-CM | POA: Diagnosis not present

## 2017-08-15 DIAGNOSIS — R079 Chest pain, unspecified: Secondary | ICD-10-CM | POA: Diagnosis not present

## 2017-08-15 DIAGNOSIS — R002 Palpitations: Secondary | ICD-10-CM | POA: Insufficient documentation

## 2017-08-15 NOTE — Progress Notes (Signed)
  Echocardiogram 2D Echocardiogram has been performed.  Christine Castro, Christine Castro 08/15/2017, 9:04 AM

## 2017-08-16 ENCOUNTER — Telehealth: Payer: Self-pay | Admitting: Cardiology

## 2017-08-16 NOTE — Telephone Encounter (Signed)
Left voicemail for the patient regarding leaving the monitor on for the full 48 hours.

## 2017-08-16 NOTE — Telephone Encounter (Signed)
Please call patient regarding her HM and us not being able to have the results before her C-Section on Tuesday. She is wondering should she just wear for 24 hrs and not 48??

## 2017-08-20 ENCOUNTER — Encounter (HOSPITAL_COMMUNITY)
Admission: RE | Admit: 2017-08-20 | Discharge: 2017-08-20 | Disposition: A | Discharge status: Home / Self Care | Payer: Managed Care, Other (non HMO) | Source: Ambulatory Visit | Attending: Obstetrics and Gynecology | Admitting: Obstetrics and Gynecology

## 2017-08-20 HISTORY — DX: Paresthesia of skin: R20.2

## 2017-08-20 HISTORY — DX: Unspecified abnormal cytological findings in specimens from vagina: R87.629

## 2017-08-20 LAB — CBC
HCT: 34.2 % — ABNORMAL LOW (ref 36.0–46.0)
Hemoglobin: 10.9 g/dL — ABNORMAL LOW (ref 12.0–15.0)
MCH: 27 pg (ref 26.0–34.0)
MCHC: 31.9 g/dL (ref 30.0–36.0)
MCV: 84.7 fL (ref 78.0–100.0)
PLATELETS: 181 10*3/uL (ref 150–400)
RBC: 4.04 MIL/uL (ref 3.87–5.11)
RDW: 14.6 % (ref 11.5–15.5)
WBC: 10.3 10*3/uL (ref 4.0–10.5)

## 2017-08-20 LAB — TYPE AND SCREEN
ABO/RH(D): O POS
Antibody Screen: NEGATIVE

## 2017-08-20 NOTE — Patient Instructions (Signed)
Christine Castro Christine Castro  08/20/2017   Your procedure is scheduled on:  08/21/2017  Enter through the Main Entrance of [facility] at 0900 AM.  Pick up the phone at the desk and dial [phone]  Call this number if you have problems the morning of surgery:[phone]  Remember:   Do not eat food:After Midnight.  Do not drink clear liquids: After Midnight.  Take these medicines the morning of surgery with A SIP OF WATER: none   Do not wear jewelry, make-up or nail polish.  Do not wear lotions, powders, or perfumes. Do not wear deodorant.  Do not shave 48 hours prior to surgery.  Do not bring valuables to the hospital.  [facility] is not   responsible for any belongings or valuables brought to the hospital.  Contacts, dentures or bridgework may not be worn into surgery.  Leave suitcase in the car. After surgery it may be brought to your room.  For patients admitted to the hospital, checkout time is 11:00 AM the day of              discharge.    N/A   Please read over the following fact sheets that you were given:   Surgical Site Infection Prevention

## 2017-08-20 NOTE — Telephone Encounter (Signed)
Pt called Friday to see if there was any way she could get her results back before her delivery of her baby via C-section,to reassure she is ok for this. Pt had a 48 holter monitor that ended Friday 11/16 4:30pm. I informed Dr. Tomie Chinaevankar about this he stated this we wouldn't have the results back in time enough but this wouldn't be a way to indicate she is cleared anyway, but feels fine with her going through with the procedure. The pt is aware of all of this and verbalized understanding.

## 2017-08-21 ENCOUNTER — Inpatient Hospital Stay (HOSPITAL_COMMUNITY): Payer: Managed Care, Other (non HMO) | Admitting: Anesthesiology

## 2017-08-21 ENCOUNTER — Inpatient Hospital Stay (HOSPITAL_COMMUNITY)
Admission: RE | Admit: 2017-08-21 | Discharge: 2017-08-24 | DRG: 787 | Disposition: A | Discharge status: Home / Self Care | Payer: Managed Care, Other (non HMO) | Source: Ambulatory Visit | Attending: Obstetrics and Gynecology | Admitting: Obstetrics and Gynecology

## 2017-08-21 ENCOUNTER — Encounter (HOSPITAL_COMMUNITY)
Admission: RE | Disposition: A | Discharge status: Home / Self Care | Payer: Self-pay | Source: Ambulatory Visit | Attending: Obstetrics and Gynecology

## 2017-08-21 ENCOUNTER — Encounter (HOSPITAL_COMMUNITY): Payer: Self-pay | Admitting: *Deleted

## 2017-08-21 DIAGNOSIS — Z3A38 38 weeks gestation of pregnancy: Secondary | ICD-10-CM

## 2017-08-21 DIAGNOSIS — O34211 Maternal care for low transverse scar from previous cesarean delivery: Principal | ICD-10-CM | POA: Diagnosis present

## 2017-08-21 DIAGNOSIS — D62 Acute posthemorrhagic anemia: Secondary | ICD-10-CM | POA: Diagnosis not present

## 2017-08-21 DIAGNOSIS — O9081 Anemia of the puerperium: Secondary | ICD-10-CM | POA: Diagnosis not present

## 2017-08-21 DIAGNOSIS — O99824 Streptococcus B carrier state complicating childbirth: Secondary | ICD-10-CM | POA: Diagnosis present

## 2017-08-21 DIAGNOSIS — O9852 Other viral diseases complicating childbirth: Secondary | ICD-10-CM | POA: Diagnosis present

## 2017-08-21 DIAGNOSIS — B001 Herpesviral vesicular dermatitis: Secondary | ICD-10-CM | POA: Diagnosis present

## 2017-08-21 DIAGNOSIS — Z98891 History of uterine scar from previous surgery: Secondary | ICD-10-CM

## 2017-08-21 DIAGNOSIS — Z886 Allergy status to analgesic agent status: Secondary | ICD-10-CM | POA: Diagnosis not present

## 2017-08-21 DIAGNOSIS — O34219 Maternal care for unspecified type scar from previous cesarean delivery: Secondary | ICD-10-CM | POA: Diagnosis present

## 2017-08-21 LAB — RPR: RPR Ser Ql: NONREACTIVE

## 2017-08-21 SURGERY — Surgical Case
Anesthesia: Epidural

## 2017-08-21 MED ORDER — SODIUM CHLORIDE 0.9 % IR SOLN
Status: DC | PRN
  Administered 2017-08-21 (×2): 1000 mL

## 2017-08-21 MED ORDER — OXYTOCIN 40 UNITS IN LACTATED RINGERS INFUSION - SIMPLE MED: 2.5000 [IU]/h | INTRAVENOUS | Status: DC

## 2017-08-21 MED ORDER — OXYCODONE HCL 5 MG PO TABS
5.0000 mg | ORAL_TABLET | ORAL | Status: DC | PRN
  Administered 2017-08-22 – 2017-08-23 (×5): 5 mg via ORAL
  Filled 2017-08-21 (×6): qty 1

## 2017-08-21 MED ORDER — MEPERIDINE HCL 25 MG/ML IJ SOLN: 6.2500 mg | INTRAMUSCULAR | Status: DC | PRN

## 2017-08-21 MED ORDER — DEXAMETHASONE SODIUM PHOSPHATE 4 MG/ML IJ SOLN
INTRAMUSCULAR | Status: DC | PRN
  Administered 2017-08-21: 4 mg via INTRAVENOUS

## 2017-08-21 MED ORDER — BUPIVACAINE HCL (PF) 0.25 % IJ SOLN
INTRAMUSCULAR | Status: DC | PRN
  Administered 2017-08-21: 10 mL

## 2017-08-21 MED ORDER — OXYTOCIN 10 UNIT/ML IJ SOLN
INTRAMUSCULAR | Status: AC
  Filled 2017-08-21: qty 4

## 2017-08-21 MED ORDER — HYDROMORPHONE HCL 1 MG/ML IJ SOLN
INTRAMUSCULAR | Status: AC
  Administered 2017-08-21: 0.5 mg via INTRAVENOUS
  Filled 2017-08-21: qty 0.5

## 2017-08-21 MED ORDER — MENTHOL 3 MG MT LOZG: 1.0000 | LOZENGE | OROMUCOSAL | Status: DC | PRN

## 2017-08-21 MED ORDER — FENTANYL CITRATE (PF) 100 MCG/2ML IJ SOLN
INTRAMUSCULAR | Status: DC | PRN
  Administered 2017-08-21: 20 ug via INTRATHECAL

## 2017-08-21 MED ORDER — SIMETHICONE 80 MG PO CHEW
80.0000 mg | CHEWABLE_TABLET | ORAL | Status: DC
  Administered 2017-08-21 – 2017-08-24 (×3): 80 mg via ORAL
  Filled 2017-08-21 (×3): qty 1

## 2017-08-21 MED ORDER — FENTANYL CITRATE (PF) 100 MCG/2ML IJ SOLN
INTRAMUSCULAR | Status: AC
  Filled 2017-08-21: qty 2

## 2017-08-21 MED ORDER — COCONUT OIL OIL
1.0000 "application " | TOPICAL_OIL | Status: DC | PRN
  Administered 2017-08-24: 1 via TOPICAL
  Filled 2017-08-21: qty 120

## 2017-08-21 MED ORDER — PROMETHAZINE HCL 25 MG/ML IJ SOLN: 6.2500 mg | INTRAMUSCULAR | Status: DC | PRN

## 2017-08-21 MED ORDER — PHENYLEPHRINE 8 MG IN D5W 100 ML (0.08MG/ML) PREMIX OPTIME
INJECTION | INTRAVENOUS | Status: DC | PRN
  Administered 2017-08-21: 60 ug/min via INTRAVENOUS

## 2017-08-21 MED ORDER — ONDANSETRON HCL 4 MG/2ML IJ SOLN
INTRAMUSCULAR | Status: DC | PRN
Start: 2017-08-21 — End: 2017-08-21
  Administered 2017-08-21: 4 mg via INTRAVENOUS

## 2017-08-21 MED ORDER — DEXAMETHASONE SODIUM PHOSPHATE 4 MG/ML IJ SOLN
INTRAMUSCULAR | Status: AC
  Filled 2017-08-21: qty 1

## 2017-08-21 MED ORDER — MORPHINE SULFATE (PF) 0.5 MG/ML IJ SOLN
INTRAMUSCULAR | Status: DC | PRN
  Administered 2017-08-21: .2 mg via INTRATHECAL

## 2017-08-21 MED ORDER — SIMETHICONE 80 MG PO CHEW
80.0000 mg | CHEWABLE_TABLET | Freq: Three times a day (TID) | ORAL | Status: DC
  Administered 2017-08-22 – 2017-08-24 (×6): 80 mg via ORAL
  Filled 2017-08-21 (×7): qty 1

## 2017-08-21 MED ORDER — HYDROMORPHONE HCL 1 MG/ML IJ SOLN
0.2500 mg | INTRAMUSCULAR | Status: DC | PRN
  Administered 2017-08-21: 0.5 mg via INTRAVENOUS
  Administered 2017-08-21 (×2): 0.25 mg via INTRAVENOUS
  Administered 2017-08-21: 0.5 mg via INTRAVENOUS

## 2017-08-21 MED ORDER — BUPIVACAINE HCL (PF) 0.25 % IJ SOLN
INTRAMUSCULAR | Status: AC
  Filled 2017-08-21: qty 30

## 2017-08-21 MED ORDER — ACETAMINOPHEN 325 MG PO TABS
650.0000 mg | ORAL_TABLET | ORAL | Status: DC | PRN
  Administered 2017-08-21 – 2017-08-22 (×4): 650 mg via ORAL
  Filled 2017-08-21 (×6): qty 2

## 2017-08-21 MED ORDER — PRENATAL MULTIVITAMIN CH
1.0000 | ORAL_TABLET | Freq: Every day | ORAL | Status: DC
  Administered 2017-08-23: 1 via ORAL
  Filled 2017-08-21: qty 1

## 2017-08-21 MED ORDER — OXYCODONE HCL 5 MG PO TABS: 10.0000 mg | ORAL_TABLET | ORAL | Status: DC | PRN

## 2017-08-21 MED ORDER — ONDANSETRON HCL 4 MG/2ML IJ SOLN
INTRAMUSCULAR | Status: AC
  Filled 2017-08-21: qty 2

## 2017-08-21 MED ORDER — LACTATED RINGERS IV SOLN
INTRAVENOUS | Status: DC | PRN
  Administered 2017-08-21: 12:00:00 via INTRAVENOUS

## 2017-08-21 MED ORDER — BUPIVACAINE IN DEXTROSE 0.75-8.25 % IT SOLN
INTRATHECAL | Status: DC | PRN
  Administered 2017-08-21: 1.4 mL via INTRATHECAL

## 2017-08-21 MED ORDER — DIPHENHYDRAMINE HCL 25 MG PO CAPS
25.0000 mg | ORAL_CAPSULE | Freq: Four times a day (QID) | ORAL | Status: DC | PRN
  Administered 2017-08-22: 25 mg via ORAL
  Filled 2017-08-21: qty 1

## 2017-08-21 MED ORDER — WITCH HAZEL-GLYCERIN EX PADS: 1.0000 "application " | MEDICATED_PAD | CUTANEOUS | Status: DC | PRN

## 2017-08-21 MED ORDER — CEFAZOLIN SODIUM-DEXTROSE 2-4 GM/100ML-% IV SOLN
2.0000 g | INTRAVENOUS | Status: AC
  Administered 2017-08-21: 2 g via INTRAVENOUS
  Filled 2017-08-21: qty 100

## 2017-08-21 MED ORDER — PHENYLEPHRINE 8 MG IN D5W 100 ML (0.08MG/ML) PREMIX OPTIME
INJECTION | INTRAVENOUS | Status: AC
  Filled 2017-08-21: qty 100

## 2017-08-21 MED ORDER — NALBUPHINE HCL 10 MG/ML IJ SOLN
5.0000 mg | Freq: Once | INTRAMUSCULAR | Status: AC
  Administered 2017-08-21: 5 mg via SUBCUTANEOUS

## 2017-08-21 MED ORDER — LACTATED RINGERS IV SOLN
INTRAVENOUS | Status: DC
  Administered 2017-08-21 (×2): via INTRAVENOUS

## 2017-08-21 MED ORDER — LACTATED RINGERS IV SOLN
INTRAVENOUS | Status: DC
  Administered 2017-08-21: 23:00:00 via INTRAVENOUS

## 2017-08-21 MED ORDER — HYDROMORPHONE HCL 1 MG/ML IJ SOLN
INTRAMUSCULAR | Status: AC
Start: 2017-08-21 — End: 2017-08-21
  Administered 2017-08-21: 0.25 mg via INTRAVENOUS
  Filled 2017-08-21: qty 0.5

## 2017-08-21 MED ORDER — ONDANSETRON HCL 4 MG/2ML IJ SOLN
4.0000 mg | Freq: Four times a day (QID) | INTRAMUSCULAR | Status: DC | PRN
  Administered 2017-08-21: 4 mg via INTRAVENOUS
  Filled 2017-08-21: qty 2

## 2017-08-21 MED ORDER — MORPHINE SULFATE (PF) 0.5 MG/ML IJ SOLN
INTRAMUSCULAR | Status: AC
  Filled 2017-08-21: qty 10

## 2017-08-21 MED ORDER — SENNOSIDES-DOCUSATE SODIUM 8.6-50 MG PO TABS
2.0000 | ORAL_TABLET | ORAL | Status: DC
  Administered 2017-08-21 – 2017-08-24 (×3): 2 via ORAL
  Filled 2017-08-21 (×3): qty 2

## 2017-08-21 MED ORDER — SIMETHICONE 80 MG PO CHEW
80.0000 mg | CHEWABLE_TABLET | ORAL | Status: DC | PRN
  Administered 2017-08-22: 80 mg via ORAL

## 2017-08-21 MED ORDER — ZOLPIDEM TARTRATE 5 MG PO TABS: 5.0000 mg | ORAL_TABLET | Freq: Every evening | ORAL | Status: DC | PRN

## 2017-08-21 MED ORDER — SCOPOLAMINE 1 MG/3DAYS TD PT72
1.0000 | MEDICATED_PATCH | TRANSDERMAL | Status: DC
  Administered 2017-08-21: 1.5 mg via TRANSDERMAL
  Filled 2017-08-21: qty 1

## 2017-08-21 MED ORDER — OXYTOCIN 10 UNIT/ML IJ SOLN
INTRAVENOUS | Status: DC | PRN
  Administered 2017-08-21: 40 [IU] via INTRAVENOUS

## 2017-08-21 MED ORDER — DIBUCAINE 1 % RE OINT
1.0000 | TOPICAL_OINTMENT | RECTAL | Status: DC | PRN
Start: 2017-08-21 — End: 2017-08-24

## 2017-08-21 MED ORDER — HYDROMORPHONE HCL 1 MG/ML IJ SOLN
INTRAMUSCULAR | Status: AC
  Filled 2017-08-21: qty 0.5

## 2017-08-21 SURGICAL SUPPLY — 44 items
BARRIER ADHS 3X4 INTERCEED (GAUZE/BANDAGES/DRESSINGS) ×2 IMPLANT
BENZOIN TINCTURE PRP APPL 2/3 (GAUZE/BANDAGES/DRESSINGS) ×2 IMPLANT
CHLORAPREP W/TINT 26ML (MISCELLANEOUS) ×2 IMPLANT
CLAMP CORD UMBIL (MISCELLANEOUS) ×2 IMPLANT
CLOTH BEACON ORANGE TIMEOUT ST (SAFETY) ×2 IMPLANT
CONTAINER PREFILL 10% NBF 15ML (MISCELLANEOUS) IMPLANT
DRAPE C SECTION CLR SCREEN (DRAPES) ×2 IMPLANT
DRSG OPSITE POSTOP 4X10 (GAUZE/BANDAGES/DRESSINGS) ×2 IMPLANT
ELECT REM PT RETURN 9FT ADLT (ELECTROSURGICAL) ×2
ELECTRODE REM PT RTRN 9FT ADLT (ELECTROSURGICAL) ×1 IMPLANT
EXTRACTOR VACUUM M CUP 4 TUBE (SUCTIONS) ×2 IMPLANT
GLOVE BIOGEL PI IND STRL 7.0 (GLOVE) ×6 IMPLANT
GLOVE BIOGEL PI INDICATOR 7.0 (GLOVE) ×6
GLOVE ECLIPSE 6.5 STRL STRAW (GLOVE) ×2 IMPLANT
GOWN STRL REUS W/TWL LRG LVL3 (GOWN DISPOSABLE) ×4 IMPLANT
KIT ABG SYR 3ML LUER SLIP (SYRINGE) IMPLANT
NEEDLE HYPO 22GX1.5 SAFETY (NEEDLE) ×2 IMPLANT
NEEDLE HYPO 25X5/8 SAFETYGLIDE (NEEDLE) IMPLANT
NS IRRIG 1000ML POUR BTL (IV SOLUTION) ×2 IMPLANT
PACK C SECTION WH (CUSTOM PROCEDURE TRAY) ×2 IMPLANT
PAD OB MATERNITY 4.3X12.25 (PERSONAL CARE ITEMS) ×4 IMPLANT
RTRCTR C-SECT PINK 25CM LRG (MISCELLANEOUS) ×2 IMPLANT
STRIP CLOSURE SKIN 1/2X4 (GAUZE/BANDAGES/DRESSINGS) ×2 IMPLANT
SUT CHROMIC GUT AB #0 18 (SUTURE) IMPLANT
SUT MNCRL 0 VIOLET CTX 36 (SUTURE) ×3 IMPLANT
SUT MON AB 2-0 SH 27 (SUTURE)
SUT MON AB 2-0 SH27 (SUTURE) IMPLANT
SUT MON AB 3-0 SH 27 (SUTURE)
SUT MON AB 3-0 SH27 (SUTURE) IMPLANT
SUT MON AB 4-0 PS1 27 (SUTURE) IMPLANT
SUT MONOCRYL 0 CTX 36 (SUTURE) ×3
SUT PLAIN 2 0 (SUTURE)
SUT PLAIN 2 0 XLH (SUTURE) IMPLANT
SUT PLAIN ABS 2-0 CT1 27XMFL (SUTURE) IMPLANT
SUT PROLENE 1 CT (SUTURE) ×2 IMPLANT
SUT VIC AB 0 CT1 36 (SUTURE) ×6 IMPLANT
SUT VIC AB 2-0 CT1 27 (SUTURE) ×1
SUT VIC AB 2-0 CT1 TAPERPNT 27 (SUTURE) ×1 IMPLANT
SUT VIC AB 4-0 KS 27 (SUTURE) ×2 IMPLANT
SUT VIC AB 4-0 PS2 27 (SUTURE) IMPLANT
SUT VICRYL 0 TIES 12 18 (SUTURE) ×2 IMPLANT
SYR CONTROL 10ML LL (SYRINGE) ×2 IMPLANT
TOWEL OR 17X24 6PK STRL BLUE (TOWEL DISPOSABLE) ×2 IMPLANT
TRAY FOLEY BAG SILVER LF 14FR (SET/KITS/TRAYS/PACK) ×2 IMPLANT

## 2017-08-21 NOTE — Transfer of Care (Signed)
Immediate Anesthesia Transfer of Care Note  Patient: Christine Castro  Procedure(s) Performed: Repeat CESAREAN SECTION (N/A )  Patient Location: PACU  Anesthesia Type:Spinal  Level of Consciousness: awake  Airway & Oxygen Therapy: Patient Spontanous Breathing  Post-op Assessment: Report given to RN and Post -op Vital signs reviewed and stable  Post vital signs: stable  Last Vitals:  Vitals:   08/21/17 0917  BP: 125/88  Pulse: 89  Resp: 18  Temp: 36.8 C    Last Pain:  Vitals:   08/21/17 0917  TempSrc: Oral         Complications: No apparent anesthesia complications

## 2017-08-21 NOTE — Anesthesia Preprocedure Evaluation (Signed)
Anesthesia Evaluation  Patient identified by MRN, date of birth, ID band Patient awake    Reviewed: Allergy & Precautions, H&P , NPO status , Patient's Chart, lab work & pertinent test results  Airway Mallampati: II  TM Distance: >3 FB Neck ROM: full    Dental no notable dental hx. (+) Teeth Intact   Pulmonary neg pulmonary ROS, former smoker,    Pulmonary exam normal breath sounds clear to auscultation       Cardiovascular negative cardio ROS Normal cardiovascular exam Rhythm:regular Rate:Normal     Neuro/Psych negative psych ROS   GI/Hepatic Neg liver ROS,   Endo/Other  negative endocrine ROS  Renal/GU negative Renal ROS     Musculoskeletal   Abdominal (+) + obese,   Peds  Hematology  (+) anemia ,   Anesthesia Other Findings   Reproductive/Obstetrics (+) Pregnancy                             Anesthesia Physical Anesthesia Plan  ASA: II  Anesthesia Plan: Epidural   Post-op Pain Management:    Induction:   PONV Risk Score and Plan:   Airway Management Planned:   Additional Equipment:   Intra-op Plan:   Post-operative Plan:   Informed Consent: I have reviewed the patients History and Physical, chart, labs and discussed the procedure including the risks, benefits and alternatives for the proposed anesthesia with the patient or authorized representative who has indicated his/her understanding and acceptance.     Plan Discussed with:   Anesthesia Plan Comments:         Anesthesia Quick Evaluation

## 2017-08-21 NOTE — Lactation Note (Signed)
This note was copied from a baby's chart. Lactation Consultation Note  Patient Name: Girl Willena Moes Today's Date: 08/21/2017 Reason for consult: Initial assessment Baby at 8 hr of life. Experienced bf mom reports baby is latching well. She denies breast or nipple pain, voiced no concerns. Mom reports with her 2nd child her let down was so hard that she had to pre pump so he would not choke and she was an over producer so she did block feeding. Encouraged her to bf on demand unless she notices symptoms. She stated she can manually express and has a spoon at the bedside. Given lactation handouts. Aware of OP services and support group.   Maternal Data Has patient been taught Hand Expression?: Yes Does the patient have breastfeeding experience prior to this delivery?: Yes  Feeding Feeding Type: Breast Fed Length of feed: 5 min  LATCH Score Latch: Grasps breast easily, tongue down, lips flanged, rhythmical sucking.  Audible Swallowing: Spontaneous and intermittent  Type of Nipple: Everted at rest and after stimulation  Comfort (Breast/Nipple): Soft / non-tender  Hold (Positioning): No assistance needed to correctly position infant at breast.  LATCH Score: 10  Interventions Interventions: Breast feeding basics reviewed;Skin to skin;Breast massage;Hand express;Support pillows;Position options;DEBP  Lactation Tools Discussed/Used WIC Program: No   Consult Status Consult Status: Follow-up Date: 08/22/17 Follow-up type: In-patient    Rulon Eisenmengerlizabeth E Rhylei Mcquaig 08/21/2017, 8:21 PM

## 2017-08-21 NOTE — Anesthesia Procedure Notes (Addendum)
Spinal  Patient location during procedure: OR Start time: 08/21/2017 11:05 AM End time: 08/21/2017 11:07 AM Staffing Anesthesiologist: Leilani AbleHatchett, Alycen Mack, MD Resident/CRNA: Shanon PayorGregory, Suzanne M, CRNA Performed: other anesthesia staff  Preanesthetic Checklist Completed: patient identified, surgical consent, pre-op evaluation, timeout performed, IV checked, risks and benefits discussed and monitors and equipment checked Spinal Block Patient position: sitting Prep: DuraPrep Patient monitoring: heart rate, continuous pulse ox and blood pressure Approach: midline Location: L3-4 Injection technique: single-shot Needle Needle type: Pencan  Needle gauge: 24 G Needle length: 10 cm Needle insertion depth: 5 cm Assessment Sensory level: T4 Additional Notes Performed by B. Blake DivineLackey SRNA

## 2017-08-21 NOTE — Brief Op Note (Signed)
08/21/2017  12:18 PM  PATIENT:  Christine Castro  31 y.o. female  PRE-OPERATIVE DIAGNOSIS:  Previous Cesarean Section x 2, term gestation  POST-OPERATIVE DIAGNOSIS:  Previous Cesarean Section x 2, term gestation  PROCEDURE:  Repeat cesarean section, kerr hysterotomy  SURGEON:  Surgeon(s) and Role:    * Maxie Betterousins, Kasmira Cacioppo, MD - Primary  PHYSICIAN ASSISTANT:   ASSISTANTS: Carlean JewsMeredith Sigmon, CNM   ANESTHESIA:   spinal Findings: live female floating vtx, nl tubes and ovaries, adhesions( uterine, anterior abd wall omental, bladder) EBL:  817 mL   BLOOD ADMINISTERED:none  DRAINS: none   LOCAL MEDICATIONS USED:  MARCAINE     SPECIMEN:  Source of Specimen:  placenta not sent  DISPOSITION OF SPECIMEN:  N/A  COUNTS:  YES  TOURNIQUET:  * No tourniquets in log *  DICTATION: .Other Dictation: Dictation Number [id number]  PLAN OF CARE: Admit to inpatient   PATIENT DISPOSITION:  PACU - hemodynamically stable.   Delay start of Pharmacological VTE agent (>24hrs) due to surgical blood loss or risk of bleeding: yes

## 2017-08-21 NOTE — Anesthesia Postprocedure Evaluation (Signed)
Anesthesia Post Note  Patient: Christine Castro  Procedure(s) Performed: Repeat CESAREAN SECTION (N/A )     Patient location during evaluation: PACU Anesthesia Type: Spinal Level of consciousness: awake Pain management: pain level controlled Vital Signs Assessment: post-procedure vital signs reviewed and stable Respiratory status: spontaneous breathing Cardiovascular status: stable Postop Assessment: no headache, no backache, spinal receding, patient able to bend at knees and no apparent nausea or vomiting Anesthetic complications: no    Last Vitals:  Vitals:   08/21/17 1627 08/21/17 1730  BP: 135/84 134/79  Pulse: 74 70  Resp: 16 18  Temp: 36.7 C 37 C  SpO2: 99% 98%    Last Pain:  Vitals:   08/21/17 1730  TempSrc: Oral  PainSc:    Pain Goal: Patients Stated Pain Goal: 5 (08/21/17 1400)               Cortlyn Cannell JR,JOHN Gaines Cartmell

## 2017-08-22 DIAGNOSIS — D62 Acute posthemorrhagic anemia: Secondary | ICD-10-CM

## 2017-08-22 LAB — CBC
HCT: 27 % — ABNORMAL LOW (ref 36.0–46.0)
Hemoglobin: 8.6 g/dL — ABNORMAL LOW (ref 12.0–15.0)
MCH: 27 pg (ref 26.0–34.0)
MCHC: 31.9 g/dL (ref 30.0–36.0)
MCV: 84.6 fL (ref 78.0–100.0)
PLATELETS: 162 10*3/uL (ref 150–400)
RBC: 3.19 MIL/uL — ABNORMAL LOW (ref 3.87–5.11)
RDW: 14.7 % (ref 11.5–15.5)
WBC: 9.7 10*3/uL (ref 4.0–10.5)

## 2017-08-22 LAB — BIRTH TISSUE RECOVERY COLLECTION (PLACENTA DONATION)

## 2017-08-22 MED ORDER — MAGNESIUM OXIDE 400 (241.3 MG) MG PO TABS
400.0000 mg | ORAL_TABLET | Freq: Every day | ORAL | Status: DC
  Administered 2017-08-22 – 2017-08-24 (×3): 400 mg via ORAL
  Filled 2017-08-22 (×4): qty 1

## 2017-08-22 MED ORDER — FERROUS SULFATE 325 (65 FE) MG PO TABS
325.0000 mg | ORAL_TABLET | Freq: Three times a day (TID) | ORAL | Status: DC
  Administered 2017-08-22 – 2017-08-24 (×3): 325 mg via ORAL
  Filled 2017-08-22 (×6): qty 1

## 2017-08-22 MED ORDER — ACYCLOVIR 400 MG PO TABS
400.0000 mg | ORAL_TABLET | Freq: Two times a day (BID) | ORAL | Status: DC
  Administered 2017-08-22 – 2017-08-24 (×5): 400 mg via ORAL
  Filled 2017-08-22 (×7): qty 1

## 2017-08-22 NOTE — Lactation Note (Signed)
This note was copied from a baby's chart. Lactation Consultation Note  Patient Name: [name] Today's Date: 08/22/2017 Reason for consult: Follow-up assessment;Infant weight loss;Other (Comment)(exp BF mom , permomhad plenty of milk suplly , esp with her 2nd baby )  Baby is 127 hours old  LC reviewed and updated the doc flow sheets per mom and dad.  Per mom baby hasn't fed since 1045 am.  Baby placed baby STS, and baby woke up , rooting.  Some areola edema noted, and after hand expressed , assisted mom to get  Baby to open well and latched with breast compressions until swallows and fed 20 mins.  Baby started hanging out latched, and LC had mom take the baby off, baby fell asleep.  LC and orientee instructed mom on the use shells , hand pump with demo.  Mom denies soreness. Sore nipple and engorgement prevention and tx. Reviewed.  Mom and dad receptive to teaching, review, and seemed appreciative for assistance. Noted dad very supportive.  Mother informed of post-discharge support and given phone number to the lactation department, including services for phone call assistance; out-patient appointments; and breastfeeding support group. List of other breastfeeding resources in the community given in the handout. Encouraged mother to call for problems or concerns related to breastfeeding.      Maternal Data Has patient been taught Hand Expression?: Yes  Feeding Feeding Type: Breast Fed Length of feed: (muliple swallows, increased with breast compressions )  LATCH Score Latch: Grasps breast easily, tongue down, lips flanged, rhythmical sucking.  Audible Swallowing: Spontaneous and intermittent  Type of Nipple: Everted at rest and after stimulation  Comfort (Breast/Nipple): Soft / non-tender  Hold (Positioning): Assistance needed to correctly position infant at breast and maintain latch.  LATCH Score: 9  Interventions Interventions: Breast feeding basics  reviewed;Assisted with latch;Skin to skin;Breast massage;Hand express;Breast compression;Adjust position;Support pillows;Position options;Expressed milk;Shells;Hand pump  Lactation Tools Discussed/Used Tools: Shells;Pump Shell Type: Inverted Breast pump type: Manual Pump Review: Setup, frequency, and cleaning;Milk Storage   Consult Status Consult Status: PRN Date: 08/22/17    Christine Castro 08/22/2017, 2:48 PM

## 2017-08-22 NOTE — Progress Notes (Signed)
POSTOPERATIVE DAY # 1 S/P Elective repeat LTCS, baby girl "Romanialivia"   S:         Reports feeling okay, tired and sore - requests abdominal binder  Pt. Reports recurrent fever blisters and states she has been on Acyclovir suppression therapy.  She desires to restart Acyclovir through postpartum period.              Tolerating po intake / no nausea / no vomiting / no flatus / no BM  Denies dizziness, SOB, or CP             Bleeding is moderate             Pain controlled with Oxycodone and Tylenol             Up ad lib / ambulatory with assistance  Foley catheter out at 8am - has not voided on her own yet   Newborn breast feeding - reports she is latching well, but has been sleepy this morning    O:  VS: BP 110/82 (BP Location: Left Arm)   Pulse 66   Temp 98.3 F (36.8 C) (Oral)   Resp 18   Ht 5\' 4"  (1.626 m)   Wt 83 kg (183 lb)   LMP 11/20/2016   SpO2 98%   BMI 31.41 kg/m    LABS:               Recent Labs    08/20/17 1025 08/22/17 0557  WBC 10.3 9.7  HGB 10.9* 8.6*  PLT 181 162               Bloodtype: --/--/O POS (11/19 1025)  Rubella: Immune (04/18 0000)                                             I&O: Intake/Output      11/20 0701 - 11/21 0700 11/21 0701 - 11/22 0700   P.O. 1480    I.V. (mL/kg) 2183.5 (26.3)    Total Intake(mL/kg) 3663.5 (44.1)    Urine (mL/kg/hr) 2075    Blood 817    Total Output 2892    Net +771.5                      Physical Exam:             Alert and Oriented X3  Lungs: Clear and unlabored  Heart: regular rate and rhythm / no murmurs  Abdomen: soft, non-tender, non-distended, hypoactive bowel sounds in all quadrants             Fundus: firm, non-tender, U-2             Dressing: honeycomb dressing mild shadowing noted/ otherwise dry, intact              Incision:  approximated with sutures / no erythema / no ecchymosis / no drainage  Perineum: intact  Lochia: appropriate, no clots   Extremities: +1 BLE edema, no calf pain or  tenderness  A:        POD # 1 S/P Elective Repeat LTCS            Acute on chronic anemia - stable asymptomatic   Recurrent fever blisters   P:        Routine postoperative care  Ferrous Sulfate 325mg  TID WC  Magnesium oxide 400mg  daily   Abdominal binder   Restart Acyclovir 400mg  BID   Encouraged warm liquids for flatus  May shower this afternoon  See lactation today   D/C IV after 2 voids    Possible early discharge today   Carlean JewsMeredith Sigmon, MSN, CNM [facility]

## 2017-08-22 NOTE — Op Note (Signed)
NAME:  Christine Castro, Christine Castro                 ACCOUNT NO.:  [id number]  MEDICAL RECORD NO.:  [id number]  LOCATION:  PERIO                         FACILITY:  [facility]  PHYSICIAN:  Maxie BetterSheronette Jeena Arnett, M.D.DATE OF BIRTH:  05-08-1986  DATE OF PROCEDURE:  08/21/2017 DATE OF DISCHARGE:                              OPERATIVE REPORT   PREOPERATIVE DIAGNOSES: 1. Previous cesarean section x2. 2. Term gestation.  PROCEDURES: 1. Repeat cesarean section, Kerr hysterotomy.  POSTOPERATIVE DIAGNOSES: 1. Previous cesarean section x2. 2. Term gestation.  ANESTHESIA:  Spinal.  SURGEON:  Maxie BetterSheronette Psalms Olarte, M.D.  ASSISTANT:  Carlean JewsMeredith Sigmon, CNM  DESCRIPTION OF PROCEDURE:  Under adequate spinal anesthesia, the patient was placed in the supine position with a left lateral tilt.  She was sterilely prepped and draped in usual fashion.  An indwelling Foley catheter was sterilely placed.  Marcaine 0.25% was injected along the previous Pfannenstiel skin incision site.  Pfannenstiel skin incision was then made, carried down to the rectus fascia.  The rectus fascia was opened transversely.  The rectus fascia was bluntly and sharply dissected off the rectus muscle in a superior and inferior fashion.  The rectus muscle was split in the midline and in the process, the parietal peritoneum was opened that led to ability to further extend the excision superiorly with further dissection of the rectus muscle off the rectus fascia.  Once this was done, it was noted that the bladder was high, the lower uterine segment with adhesions.  The bladder adhesions were partially released in the midline to allow for the Alexis retractor to be placed.  Once that was done, further opening of the vesicouterine peritoneum was continued and the adhesions released in order to facilitate the surgery.  The curvilinear low transverse uterine incision was then made and extended with bandage scissors.  Artificial rupture of membranes occurred.   Clear copious amniotic fluid was noted.  The subsequent delivery of a live female, floating vertex, with the use of vacuum was accomplished after the initial attempt was without success. The baby was bulb suctioned on the abdomen.  Delayed cord clamp was then done.  The cord was subsequently clamped, cut.  The baby was transferred to the awaiting pediatricians, who assigned Apgars of 7 and 9 at 1 and 5 minutes.  The placenta was manually removed.  Uterine cavity was cleaned of debris.  Uterine incision had no extension.  It was closed in 2 layers, the first layer with 0 Monocryl running lock stitch, second layer was imbricated using 0 Monocryl suture.  Continued adhesions that were noting surrounding the lower segment were lysed.  The omentum which had curved on itself to the anterior abdominal wall was also released and small openings in the omentum was separated to decrease any possibility of obstruction.  Normal tubes and ovaries were noted bilaterally.  Periuterine adhesions were also released.  Once this was all done, the abdomen was irrigated and suctioned of debris.  The Alexis retractor was removed.  Interceed in inverted T-fashion was placed over the lower uterine segment.  The parietal peritoneum was identified and closed with 2-0 Vicryl.  The rectus fascia was closed with 0 Vicryl x2. The subcutaneous area  was irrigated.  Small bleeders cauterized. Interrupted 2-0 plain sutures placed and the skin approximated with 4-0 Vicryl subcuticular closure.  Steri-Strips and benzoin were then placed.  SPECIMEN:  Placenta not sent to Pathology.  ESTIMATED BLOOD LOSS:  800 mL.  INTRAOPERATIVE FLUID:  1600 mL.  URINE OUTPUT:  100 mL.  Sponge and instrument counts x 2 were correct.  COMPLICATIONS:  None.  The patient tolerated the procedure well, was transferred to recovery room in stable condition.     Maxie BetterSheronette Charlese Gruetzmacher, M.D.     Gonzales/MEDQ  D:  08/21/2017  T:  08/22/2017   Job:  [id number]

## 2017-08-23 MED ORDER — DIPHENHYDRAMINE HCL 50 MG/ML IJ SOLN
50.0000 mg | Freq: Every evening | INTRAMUSCULAR | Status: DC | PRN
  Filled 2017-08-23: qty 1

## 2017-08-23 MED ORDER — CYCLOBENZAPRINE HCL 5 MG PO TABS
5.0000 mg | ORAL_TABLET | Freq: Once | ORAL | Status: AC | PRN
  Administered 2017-08-23: 5 mg via ORAL
  Filled 2017-08-23: qty 1

## 2017-08-23 MED ORDER — IBUPROFEN 600 MG PO TABS: 600.0000 mg | ORAL_TABLET | Freq: Four times a day (QID) | ORAL | Status: DC | PRN

## 2017-08-23 MED ORDER — BISACODYL 10 MG RE SUPP: 10.0000 mg | Freq: Once | RECTAL | Status: DC | PRN

## 2017-08-23 MED ORDER — IBUPROFEN 600 MG PO TABS
600.0000 mg | ORAL_TABLET | Freq: Four times a day (QID) | ORAL | Status: DC | PRN
  Administered 2017-08-23: 600 mg via ORAL
  Filled 2017-08-23: qty 1

## 2017-08-23 MED ORDER — IBUPROFEN 600 MG PO TABS
600.0000 mg | ORAL_TABLET | Freq: Four times a day (QID) | ORAL | Status: DC
  Administered 2017-08-23 – 2017-08-24 (×3): 600 mg via ORAL
  Filled 2017-08-23 (×4): qty 1

## 2017-08-23 NOTE — Progress Notes (Signed)
Subjective: POD# 2 Information for the patient's newborn:  Durwin NoraDixon, Girl Jaiyanna [[id number]][[id number]]  female     Baby name: Zollie ScaleOlivia  Reports feeling lots of pain from gas this morning. Has been ambulating in room only, just drank some apple and prune juice. Feeding: breast Patient reports tolerating PO.  Breast symptoms: none Pain not well controlled on APAP and oxy. Reports ASA allergy, not tried NSAID. Denies HA/SOB/C/P/N/V/dizziness. Flatus absent. She reports vaginal bleeding as normal, without clots.  She is ambulating, urinating without difficulty.     Objective:   VS:    Vitals:   08/22/17 0613 08/22/17 1016 08/22/17 1843 08/23/17 0820  BP: 110/82 116/73 123/81 126/85  Pulse: 66 84 69 93  Resp: 18 18 16 18   Temp: 98.3 F (36.8 C) 98.6 F (37 C) 97.9 F (36.6 C) 98.3 F (36.8 C)  TempSrc: Oral Oral Oral Oral  SpO2: 98% 97%    Weight:      Height:          Intake/Output Summary (Last 24 hours) at 08/23/2017 0934 Last data filed at 08/22/2017 1250 Gross per 24 hour  Intake -  Output 800 ml  Net -800 ml        Recent Labs    08/20/17 1025 08/22/17 0557  WBC 10.3 9.7  HGB 10.9* 8.6*  HCT 34.2* 27.0*  PLT 181 162     Blood type: --/--/O POS (11/19 1025)  Rubella: Immune (04/18 0000)     Physical Exam:  General: alert, cooperative and mild distress CV: Regular rate and rhythm Resp: clear Abdomen: soft, tender, distended, hypoactive bowel sounds Incision: clean, dry and intact Uterine Fundus: firm, below umbilicus, nontender Lochia: minimal Ext: no edema, redness or tenderness in the calves or thighs      Assessment/Plan: 31 y.o.   POD# 2. [id number]                  Principal Problem:   Postpartum care following cesarean delivery (11/20) Active Problems:   Previous cesarean section complicating pregnancy   S/P cesarean section: indication: elective repeat   Cesarean delivery with vacuum assistance, delivered, current hospitalization   Acute on chronic  blood loss anemia (HCC)   ASA allergy Gas distention Stable PP  Hold iron until gut motility improved  Ibuprofen 600 mg q 6 hrs (benadryl 50 mg IM PRN), continue APAP and Oxy PRN  Warm fluids and ambulation encouraged  Routine post partum orders  Anticipate discharge tomorrow    Routine post-op care  Neta Mendsaniela C Aodhan Scheidt, CNM, MSN 08/23/2017, 9:34 AM

## 2017-08-23 NOTE — Lactation Note (Signed)
This note was copied from a baby's chart. Lactation Consultation Note  Patient Name: Christine Castro Today's Date: 08/23/2017 Reason for consult: Follow-up assessment;Infant weight loss(7% weight loss, Placed on photo tx today, , last fed at 1420 for 15 mins per mom , )  Baby recently fed with Latch score of 9 .  LC recommended due to the baby being paced on photo tx and since per mom breast are fuller and heavier prior to latch  Breast massage, hand express, pre pump with hand pump to prime the milk ducts and latch.  Yesterday LC saw the baby feed well. Per mom she is opening her mouth wider.  LC mentioned to mom to feed STS every feeding, and tonight is she starts being sleepy to feed to ask for a DEBP for post  pumping for milk to work with if needed to supplement back.  Mom has been dealing with a lot of abdominal gas discomfort, which per mom has improved but still not resolved.  Also mentioned to mom is she wakes up from a nap and her breast aren't just full and engorged to ask for reusable ice packs.  Mom and grandmother receptive to above recommendations.    Maternal Data    Feeding Feeding Type: Breast Fed Length of feed: 15 min(per mom , increased swallows )  LATCH Score Latch: Grasps breast easily, tongue down, lips flanged, rhythmical sucking.  Audible Swallowing: A few with stimulation  Type of Nipple: Everted at rest and after stimulation  Comfort (Breast/Nipple): Soft / non-tender  Hold (Positioning): No assistance needed to correctly position infant at breast.  LATCH Score: 9  Interventions Interventions: Breast feeding basics reviewed  Lactation Tools Discussed/Used Tools: Pump   Consult Status Consult Status: Follow-up Date: 08/24/17 Follow-up type: In-patient    Matilde SprangMargaret Ann Enrico Eaddy 08/23/2017, 3:24 PM

## 2017-08-23 NOTE — Progress Notes (Addendum)
Talked with D Renae FicklePaul CNM and Dr Billy Coastaavon regarding pain management during gas pain.  Will give Ibuprofen even though Pt has allergy to asa.  Discussed allergy rxns with pt and FOB.  Encouraged pt to get up and ambulate in room and in hallways.  Infant placed on bililights.

## 2017-08-24 MED ORDER — ACETAMINOPHEN 325 MG PO TABS: 650.0000 mg | ORAL_TABLET | Freq: Four times a day (QID) | ORAL | 1 refills | Status: AC | PRN

## 2017-08-24 MED ORDER — OXYCODONE HCL 10 MG PO TABS: 10.0000 mg | ORAL_TABLET | ORAL | 0 refills | Status: AC | PRN

## 2017-08-24 MED ORDER — IBUPROFEN 600 MG PO TABS: 600.0000 mg | ORAL_TABLET | Freq: Four times a day (QID) | ORAL | 0 refills | Status: AC

## 2017-08-24 MED ORDER — ACETAMINOPHEN 325 MG PO TABS
650.0000 mg | ORAL_TABLET | Freq: Four times a day (QID) | ORAL | Status: DC | PRN
  Administered 2017-08-24: 650 mg via ORAL
  Filled 2017-08-24: qty 2

## 2017-08-24 MED ORDER — MAGNESIUM CITRATE PO SOLN
0.5000 | Freq: Once | ORAL | Status: AC
  Administered 2017-08-24: 0.5 via ORAL
  Filled 2017-08-24: qty 296

## 2017-08-24 NOTE — Progress Notes (Signed)
Dr Juliene PinaMody called for status update on pt after mag citrate.  Pt reports adequate relief of gas with small amt of stool results.  She is satisfied with effects of meds.  Report given to Dr Juliene PinaMody.  Pt educated to call MD if begins to vomit, reduce gassy foods, & that she may repeat mag citrate tomorrow. Pt verb understanding. Orders rec'd for d/c.

## 2017-08-24 NOTE — Progress Notes (Signed)
D/c instructions reviewed, given, & signed for.  Pt & sig other deny ques/concerns.  Pt to be d/c home in stable condition with husband.

## 2017-08-24 NOTE — Progress Notes (Signed)
Subjective: POD# 3, scheduled repeat C/section (3rd).  Information for the patient's newborn:  Durwin NoraDixon, Girl Marikay [[id number]][[id number]]  female  Baby name: Zollie ScaleOlivia  Reports feeling bloated, no flatus yet. Will try mag-citrate. Tolerating regular diet without nausea/vomiting Has been ambulating. Inicional pain with first movement. . Feeding: breast. Breast symptoms: none Pain not well controlled on APAP and oxy. Reports ASA allergy, not tried NSAID. Denies HA/SOB/C/P/N/V/dizziness. Flatus absent. She reports vaginal bleeding as normal, without clots.  She is ambulating, urinating without difficulty.     Objective: VS:    Vitals:   08/23/17 0820 08/23/17 1856 08/23/17 1936 08/24/17 0629  BP: 126/85 (!) 103/95 127/86 117/82  Pulse: 93 100 97 85  Resp: 18 15  18   Temp: 98.3 F (36.8 C) 98.2 F (36.8 C)  98.1 F (36.7 C)  TempSrc: Oral Oral  Oral  SpO2:   98% 98%  Weight:      Height:         No intake or output data in the 24 hours ending 08/24/17 1053      Recent Labs    08/22/17 0557  WBC 9.7  HGB 8.6*  HCT 27.0*  PLT 162     Blood type: --/--/O POS (11/19 1025)  Rubella: Immune (04/18 0000)     Physical Exam:  General: alert, cooperative and mild distress CV: Regular rate and rhythm Resp: clear Abdomen: soft, tender, distended gaseous, but normal active bowel sounds Incision: clean, dry and intact Uterine Fundus: firm, below umbilicus, nontender Lochia: minimal Ext: no edema, redness or tenderness in the calves or thighs  Assessment/Plan: 31 y.o.   POD# 3. [id number]                  Principal Problem:   Postpartum care following cesarean delivery (11/20) Active Problems:   Previous cesarean section complicating pregnancy   S/P cesarean section: indication: elective repeat   Cesarean delivery with vacuum assistance, delivered, current hospitalization   Acute on chronic blood loss anemia (HCC)  ASA allergy but tolerating Ibuprofen and APAP Gas distention Stable  PP  Hold iron until gut motility improved  Ibuprofen 600 mg q 6 hrs (benadryl 50 mg IM PRN), continue APAP and Oxy PRN  Warm fluids and ambulation encouraged  Routine post partum orders   Discharge today if flatus and feels better  Robley FriesVaishali R Vail Vuncannon, MD 08/24/2017, 10:53 AM

## 2017-08-24 NOTE — Discharge Summary (Signed)
Obstetric Discharge Summary Reason for Admission: cesarean section - elective 3rd C/section.  Prenatal Procedures: ultrasound Intrapartum Procedures: cesarean: low cervical, transverse Postpartum Procedures: none Complications-Operative and Postpartum: delayed passage of flatus, passed on 3rd day pos-op, No nausea/ vomiting or eveidence of ileus  Hemoglobin  Date Value Ref Range Status  08/22/2017 8.6 (L) 12.0 - 15.0 g/dL Final    Comment:    DELTA CHECK NOTED REPEATED TO VERIFY   08/06/2017 10.9 (L) 11.1 - 15.9 g/dL Final   HCT  Date Value Ref Range Status  08/22/2017 27.0 (L) 36.0 - 46.0 % Final   Hematocrit  Date Value Ref Range Status  08/06/2017 32.7 (L) 34.0 - 46.6 % Final    Physical Exam:  General: alert and cooperative Lochia: appropriate Uterine Fundus: firm Incision: healing well DVT Evaluation: No evidence of DVT seen on physical exam.  Discharge Diagnoses: Term Pregnancy-delivered by scheduled repeat c-section  Discharge Information: Date: 08/24/2017 Activity: pelvic rest Diet: routine Medications: PNV, Ibuprofen, Iron and Percocet Condition: stable Instructions: refer to practice specific booklet Discharge to: home   Newborn Data: Live born female  Birth Weight: 7 lb 8.8 oz (3425 g) APGAR: 7, 9  Newborn Delivery   Birth date/time:  08/21/2017 11:30:00 Delivery type:  C-Section, Vacuum Assisted C-section categorization:  Repeat     Home with mother.  Robley FriesVaishali R Jessee Newnam 08/24/2017, 1:19 PM

## 2017-08-24 NOTE — Lactation Note (Signed)
This note was copied from a baby's chart. Lactation Consultation Note  Patient Name: Christine Castro Today's Date: 08/24/2017  Mom reports baby is latching and feeding well.  Milk in last night and breasts are heavy today.  No engorgement.  Mom states her last baby had a tongue tie and would like newborn to be checked.  Baby has very good elevation and extension of tongue with no restriction noted.  Instructed to continue to feed with cues.  Lactation outpatient services and support reviewed and encouraged prn.   Maternal Data    Feeding Feeding Type: Breast Fed Length of feed: 20 min  LATCH Score                   Interventions    Lactation Tools Discussed/Used     Consult Status      Huston FoleyMOULDEN, [name] 08/24/2017, 12:33 PM

## 2017-11-19 ENCOUNTER — Encounter (HOSPITAL_COMMUNITY): Payer: Self-pay

## 2018-03-25 IMAGING — US US OB COMP LESS 14 WK
1 series · 15 of 28 positions shown · non-contrast
Comparison: 08/02/2011

WEEKS PREGNANT WITH BLEEDING.:
WEEKS PREGNANT WITH BLEEDING.
EXAM:

TWIN OBSTETRIC <14WK US AND TRANSVAGINAL OB US

[Series 1: us ob comp less 14 wk · 41 acquisitions, 15 frames shown]
[im 1/41]
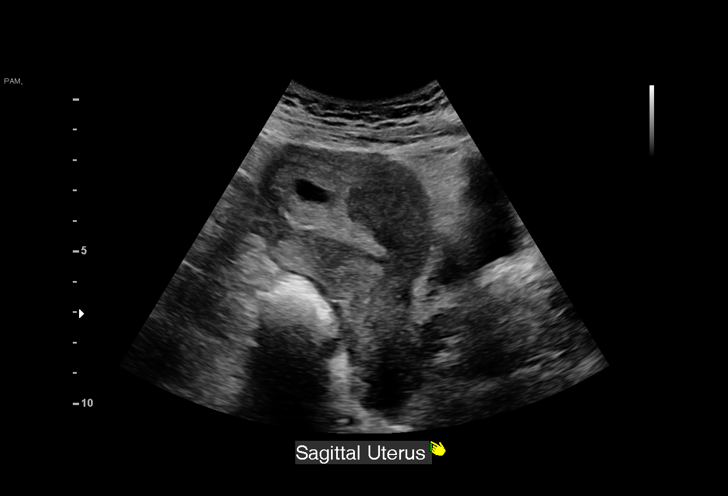
[im 3/41]
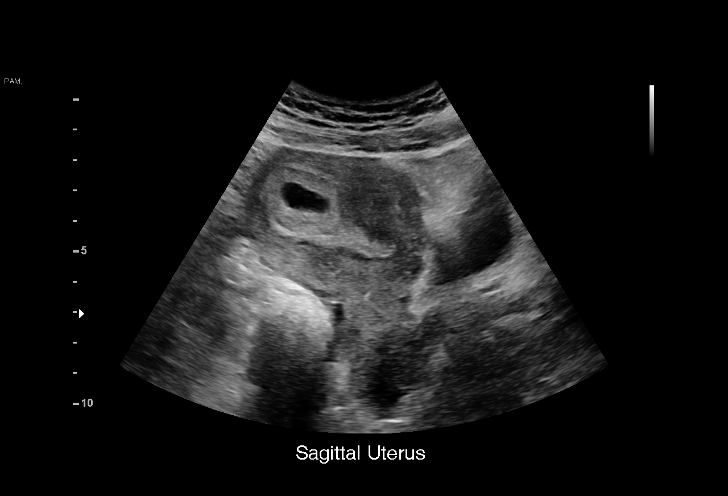
[im 6/41]
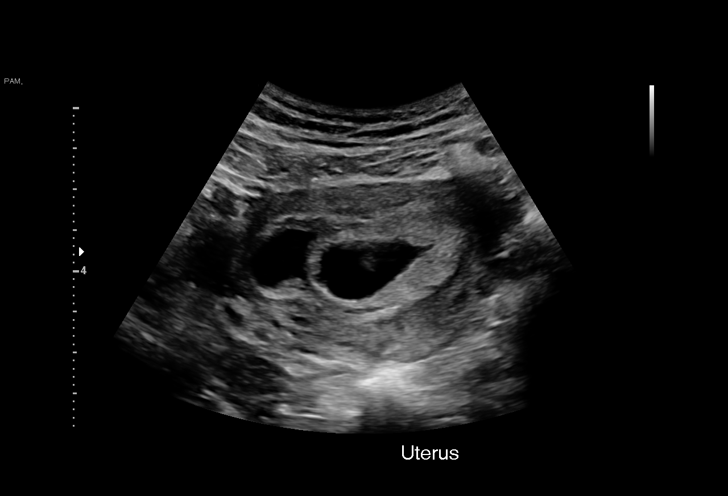
[im 9/41]
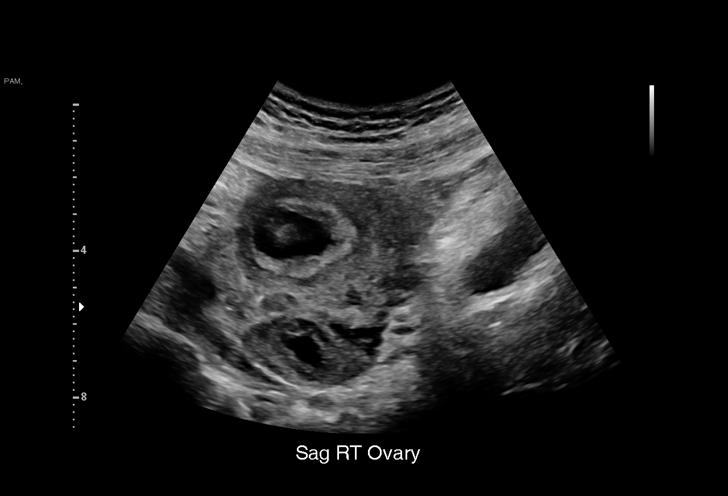
[im 12/41]
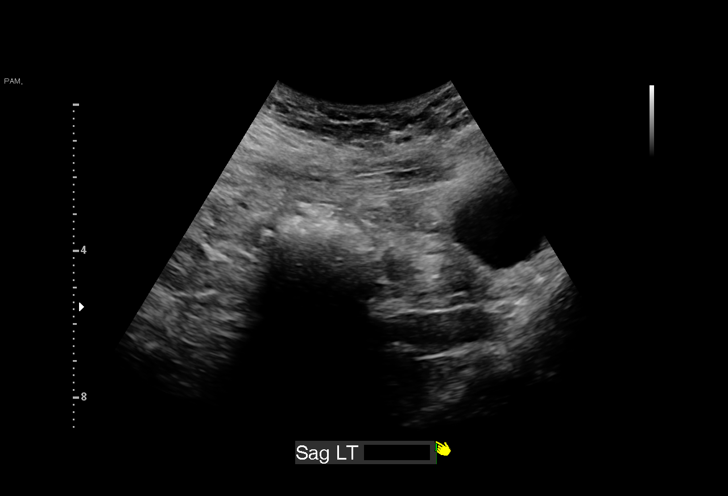
[im 15/41]
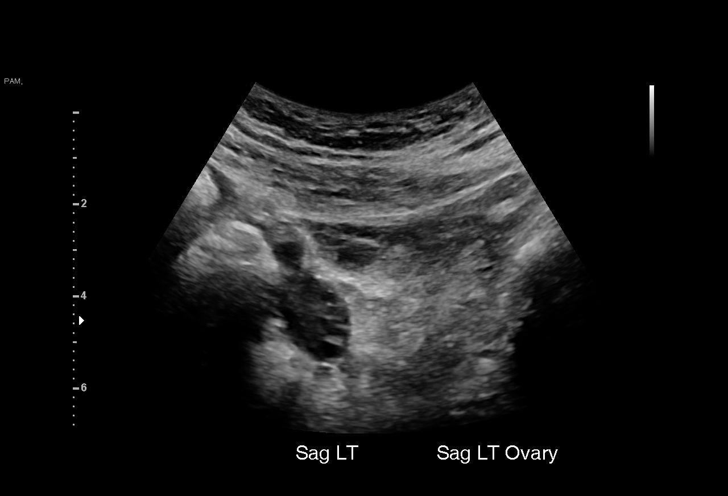
[im 18/41]
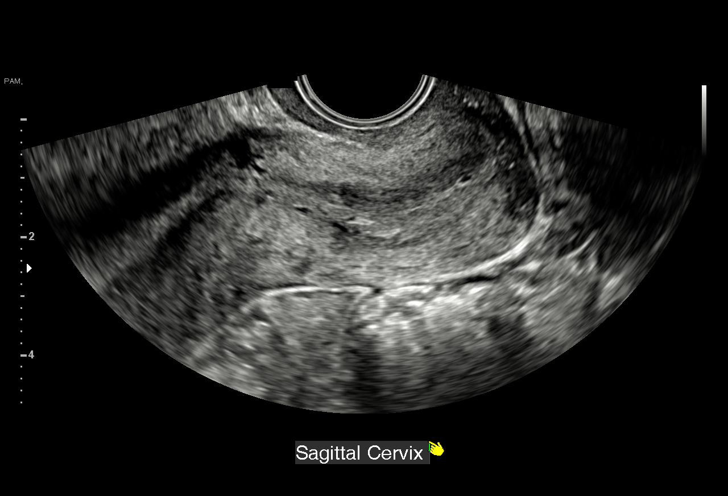
[im 21/41]
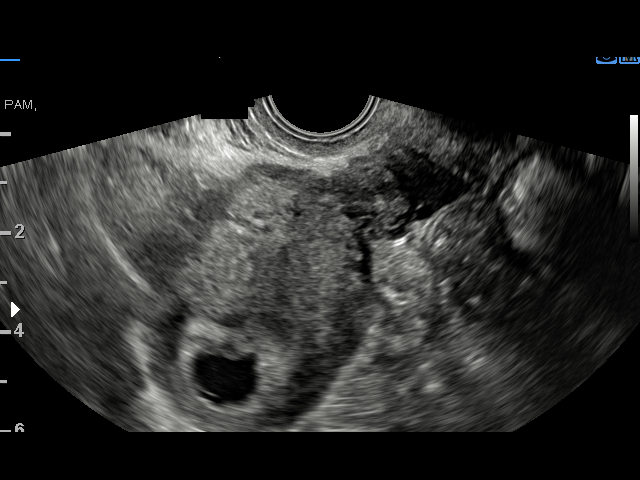
[im 23/41]
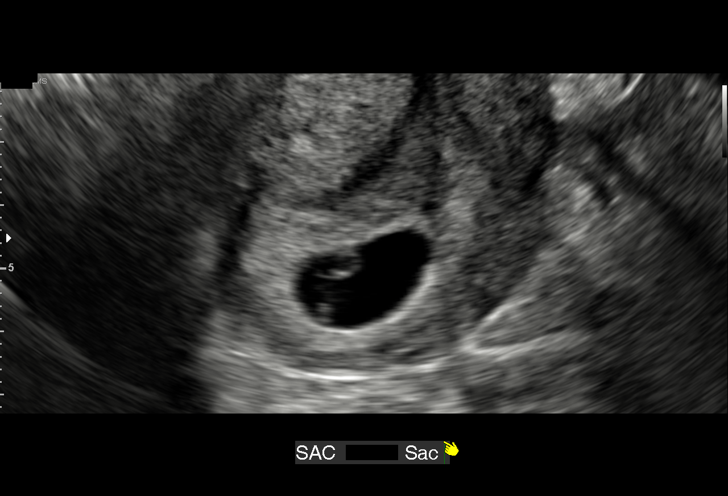
[im 26/41]
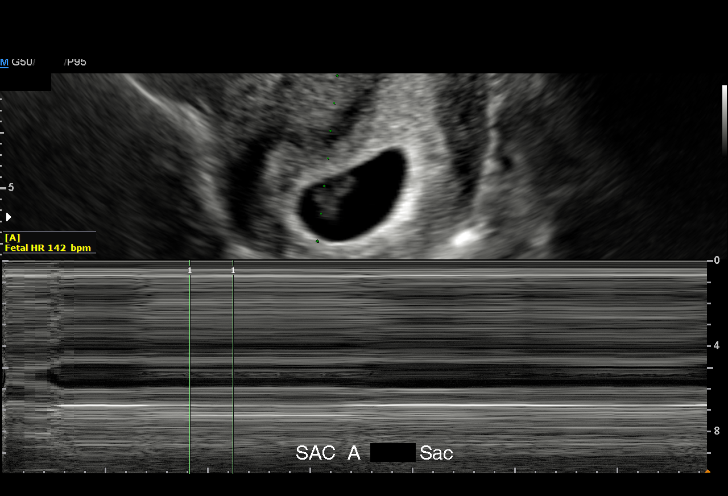
[im 29/41]
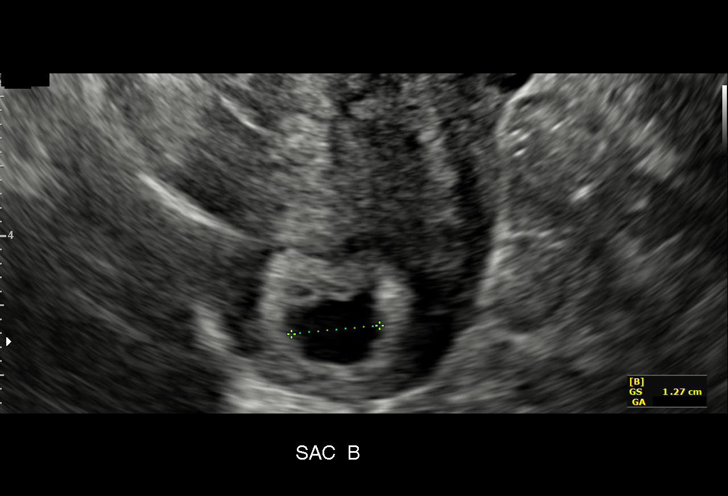
[im 32/41]
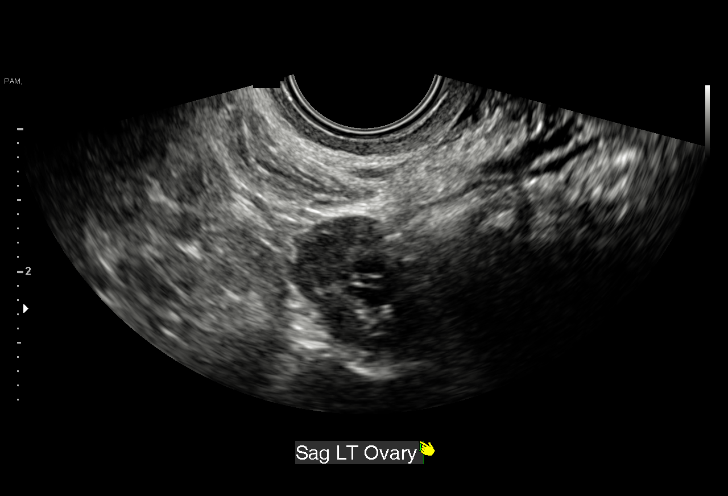
[im 35/41]
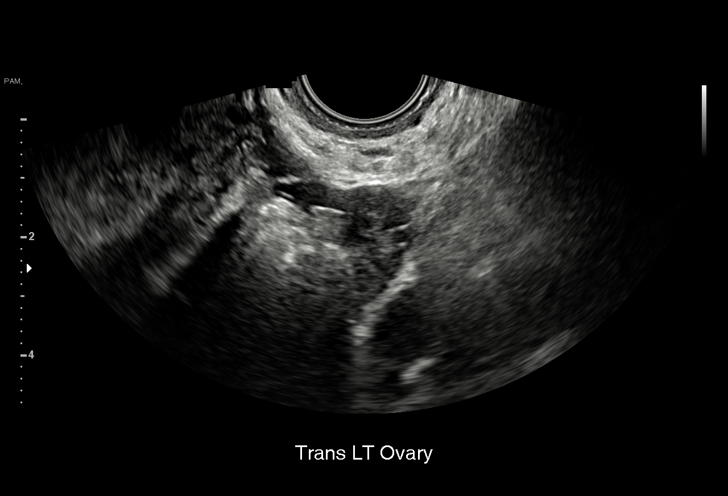
[im 38/41]
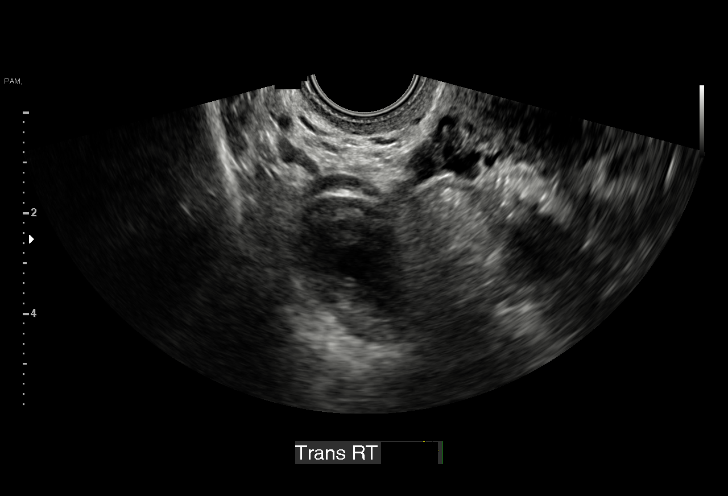
[im 41/41]
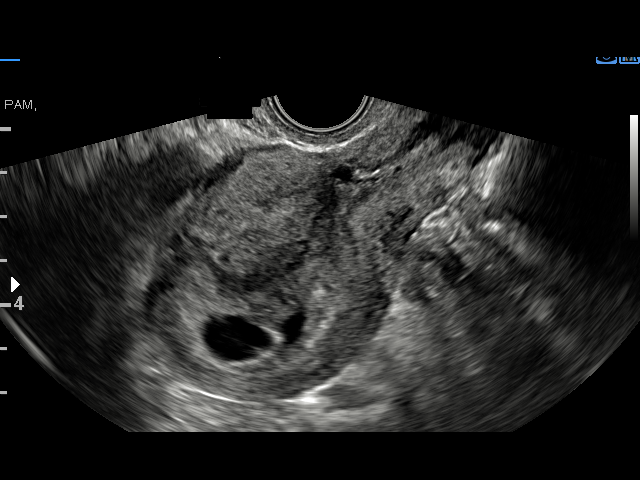

[15 of 28 positions shown; findings below may reference images not displayed]

FINDINGS: Number of IUPs:  2

Chorionicity/Amnionicity:  Dichorionic, diamniotic.

TWIN 1

Yolk sac:  Present

Embryo:  Present

Cardiac Activity: Present

Heart Rate: 142 bpm

MSD: 22  mm   7 w   0  d

CRL:  8  mm   6 w 5 d                  US EDC: 09/04/2017

TWIN 2

Irregular gestational sac.

Yolk sac:  Absent

Embryo:  Absent

Cardiac Activity: Absent

MSD: 15  mm   6 w   2  d

Subchorionic hemorrhage:  None visualized.

Maternal uterus/adnexae: Within normal limits. No significant free
fluid.
IMPRESSION: 1. Twin pregnancy, with the twin 1 demonstrating crown-rump length
of 8 mm and fetal heart rate of 142 beats per minute.
2. Twin 2 demonstrating an irregular gestational sac, with absence
of yolk sac, fetal pole, or cardiac activity. This is suspicious for
an abnormal twin 2 pregnancy.

## 2021-01-28 ENCOUNTER — Telehealth: Payer: Self-pay

## 2021-01-28 NOTE — Telephone Encounter (Signed)
   Primary Cardiologist: Garwin Brothers, MD  Chart reviewed as part of pre-operative protocol coverage. Simple dental extractions are considered low risk procedures per guidelines and generally do not require any specific cardiac clearance. It is also generally accepted that for simple extractions, orthodontics, and dental cleanings, there is no need to interrupt blood thinner therapy.   SBE prophylaxis is not required for the patient.  I will route this recommendation to the requesting party via Epic fax function and remove from pre-op pool.  Please call with questions.  Ronney Asters, NP 01/28/2021, 4:35 PM

## 2021-01-28 NOTE — Telephone Encounter (Signed)
   [facility] Pre-operative Risk Assessment    Request for surgical clearance:  1. What type of surgery is being performed? Braces Orthodontics   2. When is this surgery scheduled? TBD   3. What type of clearance is required (medical clearance vs. Pharmacy clearance to hold med vs. Both)? Medication, does the patient need to be premedication prior to dental procedure.  4. Are there any medications that need to be held prior to surgery and how long?Not specified    5. Practice name and name of physician performing surgery? [facility]   6. What is your office phone number: [phone]    7.   What is your office fax number: [phone]  8.   Anesthesia type (None, local, MAC, general) ? Not specified    Christine Castro 01/28/2021, 4:14 PM  _________________________________________________________________   (provider comments below)

## 2022-11-06 ENCOUNTER — Ambulatory Visit (INDEPENDENT_AMBULATORY_CARE_PROVIDER_SITE_OTHER): Payer: 59 | Admitting: Podiatry

## 2022-11-06 ENCOUNTER — Ambulatory Visit (INDEPENDENT_AMBULATORY_CARE_PROVIDER_SITE_OTHER): Payer: 59

## 2022-11-06 DIAGNOSIS — G5782 Other specified mononeuropathies of left lower limb: Secondary | ICD-10-CM | POA: Diagnosis not present

## 2022-11-06 DIAGNOSIS — M722 Plantar fascial fibromatosis: Secondary | ICD-10-CM

## 2022-11-06 DIAGNOSIS — M778 Other enthesopathies, not elsewhere classified: Secondary | ICD-10-CM

## 2022-11-06 MED ORDER — MELOXICAM 15 MG PO TABS: 15.0000 mg | ORAL_TABLET | Freq: Every day | ORAL | 0 refills | Status: DC

## 2022-11-06 NOTE — Progress Notes (Signed)
  Subjective:  Patient ID: Christine Castro, female    DOB: 06/29/1986,  MRN: [id number]  Chief Complaint  Patient presents with   Foot Pain    Right heel pain constant. Onset about a month ago.     37 y.o. female presents with the above complaint.  Patient presents with pain in her right heel.  She says this is been constant and going for for about a month.  She also notices some occasional pain in her left foot and some numbness and the third and fourth toes on the left foot.  She thinks that the left foot pain may be related to compensation how she is walking for the right foot pain.   Review of Systems: Negative except as noted in the HPI. Denies N/V/F/Ch.   Objective:  There were no vitals filed for this visit. There is no height or weight on file to calculate BMI. Constitutional Well developed. Well nourished.  Vascular Dorsalis pedis pulses palpable bilaterally. Posterior tibial pulses palpable bilaterally. Capillary refill normal to all digits.  No cyanosis or clubbing noted. Pedal hair growth normal.  Neurologic Normal speech. Oriented to person, place, and time. Epicritic sensation to light touch grossly present bilaterally.  Dermatologic Nails well groomed and normal in appearance. No open wounds. No skin lesions.  Orthopedic: Normal joint ROM without pain or crepitus bilaterally. No visible deformities. Tender to palpation at the calcaneal tuber right. No pain with calcaneal squeeze right. Ankle ROM diminished range of motion right. Silfverskiold Test: negative right. Pain with palpation dorsal plantar palpation with the third intermetatarsal space on the left foot patient has subjective shooting pains towards the foot and some numbness in the third and fourth toes with this.   Radiographs: Taken and reviewed. No acute fractures or dislocations. No evidence of stress fracture.  Plantar heel spur absent. Posterior heel spur absent.   Assessment:   1. Plantar  fasciitis of right foot   2. Capsulitis of foot   3. Interdigital neuroma of left foot    Plan:  Patient was evaluated and treated and all questions answered.  Plantar Fasciitis, right - XR reviewed as above.  - Educated on icing and stretching. Instructions given.  - Injection delivered to the plantar fascia as below. - DME: Night splint dispensed, Powerstep dispensed. - Pharmacologic management: Meloxicam 15 mg daily for 30 days. Educated on risks/benefits and proper taking of medication.  Procedure: Injection Tendon/Ligament Location: Right plantar fascia at the glabrous junction; medial approach. Skin Prep: alcohol Injectate: 1 cc 0.5% marcaine plain, 1 cc kenalog 10. Disposition: Patient tolerated procedure well. Injection site dressed with a band-aid.  # Interdigital neuroma of the left foot -Discussed that the patient does have symptoms and signs of a possible third intermetatarsal space neuroma.  On the left foot. -Discussed possible steroid injection for this area however patient wants to defer on this for now we will continue to monitor as she is treated for the right foot and see if that improves the left foot pain at all.  Return in about 4 weeks (around 12/04/2022) for F/u R plantar fasciitis.

## 2022-11-06 NOTE — Patient Instructions (Signed)

## 2022-11-14 NOTE — Progress Notes (Deleted)
Cardiology Office Note:    Date:  11/14/2022   ID:  Billy M Nykeisha, Shumate 10/18/1985, MRN [id number]  PCP:  Helen Hashimoto., MD  Cardiologist:  Jenean Lindau, MD  Electrophysiologist:  None   Referring MD: Helen Hashimoto., MD   No chief complaint on file. ***  History of Present Illness:    Christine Castro is a 37 y.o. female with a hx of GERD, IBS who is referred by Dr. Megan Salon for evaluation of***previously seen by Dr. Geraldo Pitter in 2018 for palpitations.  Underwent Holter monitor and echocardiogram.  Echocardiogram showed normal biventricular function, grade 1 diastolic dysfunction, no significant valvular disease.  No significant arrhythmias on Holter monitor.  Past Medical History:  Diagnosis Date   Diseases of tricuspid valve    GERD (gastroesophageal reflux disease)    Heartburn in pregnancy    Hirsutism    History of chicken pox    IBS (irritable bowel syndrome)    Maternal iron deficiency anemia 12/02/2011   Migraine, unspecified, without mention of intractable migraine without mention of status migrainosus    last one 6 months ago    Other seborrheic keratosis    Paresthesia of left leg    Polycystic ovaries    Postpartum care following cesarean delivery (7/8) 04/08/2014   PP care - s/p 1C/S 12/02/2011   Right bundle branch block    incomplete, assumed congenital,    Sciatica    history with 2013 pregnancy, no current problems   Vaginal Pap smear, abnormal     Past Surgical History:  Procedure Laterality Date   CESAREAN SECTION  12/01/2011   Procedure: CESAREAN SECTION;  Surgeon: Marvene Staff, MD;  Location: [facility];  Service: Gynecology;  Laterality: N/A;   CESAREAN SECTION N/A 04/08/2014   Procedure: Repeat CESAREAN SECTION; lysis of adhesions;  Surgeon: Marvene Staff, MD;  Location: [facility];  Service: Obstetrics;  Laterality: N/A;  EDD: 04/10/14   CESAREAN SECTION N/A 08/21/2017   Procedure: Repeat CESAREAN SECTION;  Surgeon: Servando Salina,  MD;  Location: [facility];  Service: Obstetrics;  Laterality: N/A;  EDD: 08/27/17   COLONOSCOPY     WISDOM TOOTH EXTRACTION      Current Medications: No outpatient medications have been marked as taking for the 11/15/22 encounter (Appointment) with Donato Heinz, MD.     Allergies:   Tape, Aspirin, Darvocet [propoxyphene n-acetaminophen], Dimethicone, and Elemental sulfur   Social History   Socioeconomic History   Marital status: Married    Spouse name: Not on file   Number of children: Not on file   Years of education: Not on file   Highest education level: Not on file  Occupational History   Not on file  Tobacco Use   Smoking status: Former   Smokeless tobacco: Never  Vaping Use   Vaping Use: Never used  Substance and Sexual Activity   Alcohol use: No   Drug use: No   Sexual activity: Yes    Birth control/protection: None  Other Topics Concern   Not on file  Social History Narrative   Not on file   Social Determinants of Health   Financial Resource Strain: Not on file  Food Insecurity: Not on file  Transportation Needs: Not on file  Physical Activity: Not on file  Stress: Not on file  Social Connections: Not on file     Family History: The patient's ***family history includes Anemia in her mother; Aneurysm in her paternal grandfather and  paternal grandmother; COPD in her paternal grandmother; Cancer in her maternal grandfather; Crohn's disease in her mother; Depression in her maternal grandmother and mother; Diabetes in her paternal uncle; Diverticulitis in her maternal grandmother; Eczema in her mother; Heart disease in her paternal grandmother; Hypertension in her maternal grandfather, maternal grandmother, and paternal grandmother; Migraines in her mother; Muscular dystrophy in her cousin; Peripheral vascular disease in her mother; Rheum arthritis in her father; Stroke in her maternal grandfather and paternal grandmother; Varicose Veins in her  maternal grandmother and paternal grandmother.  ROS:   Please see the history of present illness.    *** All other systems reviewed and are negative.  EKGs/Labs/Other Studies Reviewed:    The following studies were reviewed today: ***  EKG:  EKG is *** ordered today.  The ekg ordered today demonstrates ***  Recent Labs: No results found for requested labs within last 365 days.  Recent Lipid Panel No results found for: "CHOL", "TRIG", "HDL", "CHOLHDL", "VLDL", "LDLCALC", "LDLDIRECT"  Physical Exam:    VS:  There were no vitals taken for this visit.    Wt Readings from Last 3 Encounters:  08/21/17 183 lb (83 kg)  08/13/17 182 lb (82.6 kg)  08/06/17 180 lb (81.6 kg)     GEN: *** Well nourished, well developed in no acute distress HEENT: Normal NECK: No JVD; No carotid bruits LYMPHATICS: No lymphadenopathy CARDIAC: ***RRR, no murmurs, rubs, gallops RESPIRATORY:  Clear to auscultation without rales, wheezing or rhonchi  ABDOMEN: Soft, non-tender, non-distended MUSCULOSKELETAL:  No edema; No deformity  SKIN: Warm and dry NEUROLOGIC:  Alert and oriented x 3 PSYCHIATRIC:  Normal affect   ASSESSMENT:    No diagnosis found. PLAN:    In order of problems listed above:  ***   Medication Adjustments/Labs and Tests Ordered: Current medicines are reviewed at length with the patient today.  Concerns regarding medicines are outlined above.  No orders of the defined types were placed in this encounter.  No orders of the defined types were placed in this encounter.   There are no Patient Instructions on file for this visit.   Signed, Donato Heinz, MD  11/14/2022 11:14 PM    [facility]

## 2022-11-15 ENCOUNTER — Ambulatory Visit: Payer: Self-pay | Attending: Cardiology | Admitting: Cardiology

## 2022-12-03 ENCOUNTER — Other Ambulatory Visit: Payer: Self-pay | Admitting: Podiatry

## 2022-12-04 ENCOUNTER — Ambulatory Visit: Payer: Managed Care, Other (non HMO) | Admitting: Podiatry

## 2022-12-08 ENCOUNTER — Ambulatory Visit: Payer: Managed Care, Other (non HMO) | Admitting: Podiatry

## 2022-12-08 DIAGNOSIS — M722 Plantar fascial fibromatosis: Secondary | ICD-10-CM

## 2022-12-08 DIAGNOSIS — G5782 Other specified mononeuropathies of left lower limb: Secondary | ICD-10-CM | POA: Diagnosis not present

## 2022-12-08 MED ORDER — MELOXICAM 15 MG PO TABS: 15.0000 mg | ORAL_TABLET | Freq: Every day | ORAL | 0 refills | Status: DC

## 2022-12-08 NOTE — Progress Notes (Signed)
  Subjective:  Patient ID: Christine Castro, female    DOB: Dec 30, 1985,  MRN: [id number]  Chief Complaint  Patient presents with   Follow-up    Right heel/arch pain. L foot pinch nerve    37 y.o. female presents for follow-up of right heel pain.  She also has concern for left third interspace neuroma.  She says the left foot is only occasionally bothering her not a big concern at this time.  She still having some pain in the right heel.  She is wearing power steps and using a night splint.  She says the power steps have not helped very much however the next 1 does seem to decrease her pain in the morning with the first steps out of bed.  She is using meloxicam which she does think helps.  She wishes to defer any further injection at this time.   Review of Systems: Negative except as noted in the HPI. Denies N/V/F/Ch.   Objective:  There were no vitals filed for this visit. There is no height or weight on file to calculate BMI. Constitutional Well developed. Well nourished.  Vascular Dorsalis pedis pulses palpable bilaterally. Posterior tibial pulses palpable bilaterally. Capillary refill normal to all digits.  No cyanosis or clubbing noted. Pedal hair growth normal.  Neurologic Normal speech. Oriented to person, place, and time. Epicritic sensation to light touch grossly present bilaterally.  Dermatologic Nails well groomed and normal in appearance. No open wounds. No skin lesions.  Orthopedic: Normal joint ROM without pain or crepitus bilaterally. No visible deformities. Tender to palpation at the calcaneal tuber right. No pain with calcaneal squeeze right. Ankle ROM diminished range of motion right. Silfverskiold Test: negative right.    Radiographs: Taken and reviewed. No acute fractures or dislocations. No evidence of stress fracture.  Plantar heel spur absent. Posterior heel spur absent.   Assessment:   1. Plantar fasciitis of right foot   2. Interdigital neuroma of left  foot     Plan:  Patient was evaluated and treated and all questions answered.  Plantar Fasciitis, right - XR reviewed as above.  - Educated on icing and stretching. Instructions given.  - Injection deferred will consider in the future if not improved - DME: Continue with night splint and power steps - Pharmacologic management: Meloxicam 15 mg daily for 30 days. Educated on risks/benefits and proper taking of medication.  # Interdigital neuroma of the left foot -Discussed that the patient does have symptoms and signs of a possible third intermetatarsal space neuroma.  On the left foot. -Will continue to monitor as now bothering her frequently  Return in about 6 weeks (around 01/19/2023) for Follow-up right heel pain.

## 2022-12-08 NOTE — Patient Instructions (Signed)

## 2023-01-23 ENCOUNTER — Ambulatory Visit: Payer: Managed Care, Other (non HMO) | Admitting: Podiatry

## 2023-02-04 ENCOUNTER — Other Ambulatory Visit: Payer: Self-pay | Admitting: Podiatry

## 2023-02-04 DIAGNOSIS — M722 Plantar fascial fibromatosis: Secondary | ICD-10-CM

## 2023-04-08 ENCOUNTER — Other Ambulatory Visit: Payer: Self-pay | Admitting: Podiatry

## 2023-04-08 DIAGNOSIS — M722 Plantar fascial fibromatosis: Secondary | ICD-10-CM
